# Patient Record
Sex: Female | Born: 1985 | Race: White | Hispanic: No | Marital: Single | Smoking: Former smoker
Health system: Southern US, Community
[De-identification: ages and names within clinical notes are randomized; demographics above are authoritative.]

## PROBLEM LIST (undated history)

## (undated) DIAGNOSIS — F32A Depression, unspecified: Secondary | ICD-10-CM

## (undated) DIAGNOSIS — F329 Major depressive disorder, single episode, unspecified: Secondary | ICD-10-CM

## (undated) DIAGNOSIS — F419 Anxiety disorder, unspecified: Secondary | ICD-10-CM

## (undated) DIAGNOSIS — J45909 Unspecified asthma, uncomplicated: Secondary | ICD-10-CM

## (undated) HISTORY — DX: Major depressive disorder, single episode, unspecified: F32.9

## (undated) HISTORY — DX: Depression, unspecified: F32.A

## (undated) HISTORY — DX: Unspecified asthma, uncomplicated: J45.909

## (undated) HISTORY — PX: TONSILLECTOMY: SUR1361

## (undated) HISTORY — DX: Anxiety disorder, unspecified: F41.9

---

## 2012-07-27 ENCOUNTER — Encounter: Payer: BC Managed Care – PPO | Admitting: Physician Assistant

## 2012-07-27 ENCOUNTER — Ambulatory Visit (INDEPENDENT_AMBULATORY_CARE_PROVIDER_SITE_OTHER): Payer: BC Managed Care – PPO | Admitting: Family Medicine

## 2012-07-27 VITALS — BP 118/76 | HR 100 | Temp 99.2°F | Resp 18 | Ht 63.0 in | Wt 114.0 lb

## 2012-07-27 DIAGNOSIS — R6889 Other general symptoms and signs: Secondary | ICD-10-CM

## 2012-07-27 DIAGNOSIS — R05 Cough: Secondary | ICD-10-CM

## 2012-07-27 DIAGNOSIS — J310 Chronic rhinitis: Secondary | ICD-10-CM

## 2012-07-27 DIAGNOSIS — J111 Influenza due to unidentified influenza virus with other respiratory manifestations: Secondary | ICD-10-CM

## 2012-07-27 DIAGNOSIS — R509 Fever, unspecified: Secondary | ICD-10-CM

## 2012-07-27 DIAGNOSIS — J069 Acute upper respiratory infection, unspecified: Secondary | ICD-10-CM

## 2012-07-27 DIAGNOSIS — R059 Cough, unspecified: Secondary | ICD-10-CM

## 2012-07-27 LAB — POCT CBC
Granulocyte percent: 70.4 % (ref 37–80)
HCT, POC: 43.8 % (ref 37.7–47.9)
Hemoglobin: 13.6 g/dL (ref 12.2–16.2)
Lymph, poc: 1 (ref 0.6–3.4)
MCH, POC: 30 pg (ref 27–31.2)
MCHC: 31.1 g/dL — AB (ref 31.8–35.4)
MCV: 96.5 fL (ref 80–97)
MID (cbc): 0.3 (ref 0–0.9)
MPV: 9.3 fL (ref 0–99.8)
POC Granulocyte: 3.1 (ref 2–6.9)
POC LYMPH PERCENT: 22.4 %L (ref 10–50)
POC MID %: 7.2 %M (ref 0–12)
Platelet Count, POC: 167 10*3/uL (ref 142–424)
RBC: 4.54 M/uL (ref 4.04–5.48)
RDW, POC: 14 %
WBC: 4.4 10*3/uL — AB (ref 4.6–10.2)

## 2012-07-27 MED ORDER — BENZONATATE 100 MG PO CAPS: 200.0000 mg | ORAL_CAPSULE | Freq: Two times a day (BID) | ORAL | Status: DC | PRN

## 2012-07-27 MED ORDER — AMOXICILLIN-POT CLAVULANATE 875-125 MG PO TABS: 1.0000 | ORAL_TABLET | Freq: Two times a day (BID) | ORAL | Status: DC

## 2012-07-27 MED ORDER — FLUTICASONE PROPIONATE 50 MCG/ACT NA SUSP: 2.0000 | Freq: Every day | NASAL | Status: DC

## 2012-07-27 MED ORDER — ALBUTEROL SULFATE HFA 108 (90 BASE) MCG/ACT IN AERS: 2.0000 | INHALATION_SPRAY | Freq: Four times a day (QID) | RESPIRATORY_TRACT | Status: DC | PRN

## 2012-07-27 MED ORDER — HYDROCODONE-HOMATROPINE 5-1.5 MG/5ML PO SYRP: 5.0000 mL | ORAL_SOLUTION | Freq: Every evening | ORAL | Status: DC | PRN

## 2012-07-27 NOTE — Progress Notes (Signed)
 [facility]:  Office Visit  Chief Complaint:  Chief Complaint  Patient presents with  . Nasal Congestion    x 3 days  . Generalized Body Aches  . Wheezing    sob    HPI: Chloe Hunt is a 27 y.o. female who complains of  2-3 day history of + congestion, subjective fevers, body aches She has a h/o asthma and has been wheezing with coughing. She has been using advair and also her albuterol nebs.  She was in [place], returned Tuesday Morning. Then started having sxs.  Wheezing all day with cough. On advair. She does not have a rescue inhaler.    Past Medical History  Diagnosis Date  . Asthma    Past Surgical History  Procedure Laterality Date  . Tonsills remove     History   Social History  . Marital Status: Single    Spouse Name: N/A    Number of Children: N/A  . Years of Education: N/A   Social History Main Topics  . Smoking status: Current Some Day Smoker  . Smokeless tobacco: None  . Alcohol Use: No  . Drug Use: No  . Sexually Active: Yes   Other Topics Concern  . None   Social History Narrative  . None   Family History  Problem Relation Age of Onset  . Osteoporosis Mother   . COPD Father   . COPD Paternal Grandmother    No Known Allergies Prior to Admission medications   Medication Sig Start Date End Date Taking? Authorizing Provider         Fluticasone-Salmeterol (ADVAIR) 250-50 MCG/DOSE AEPB Inhale 1 puff into the lungs every 12 (twelve) hours.   Yes Historical Provider, MD     ROS: The patient denies  night sweats, unintentional weight loss, chest pain, palpitations, dyspnea on exertion, nausea, vomiting, abdominal pain, dysuria, hematuria, melena, numbness,  or tingling.   All other systems have been reviewed and were otherwise negative with the exception of those mentioned in the HPI and as above.    PHYSICAL EXAM: Filed Vitals:   07/27/12 1325  BP: 115/76  Pulse: 100  Temp: 99.2 F (37.3 C)  Resp: 18   Filed  Vitals:   07/27/12 1325  Height: 5\' 3"  (1.6 m)  Weight: 114 lb 3.2 oz (51.801 kg)  Spo2   99% Body mass index is 20.23 kg/(m^2).    General: Alert, no acute distress HEENT:  Normocephalic, atraumatic, oropharynx patent. + sinus congestion and pressure, erythematous, boggy nares.  TM bulging but not infected. No exudates Cardiovascular:  Regular rate and rhythm, no rubs murmurs or gallops.  No Carotid bruits, radial pulse intact. No pedal edema.  Respiratory: Clear to auscultation bilaterally.  No wheezes, rales, or rhonchi.  No cyanosis, no use of accessory musculature GI: No organomegaly, abdomen is soft and non-tender, positive bowel sounds.  No masses. Skin: No rashes. Neurologic: Facial musculature symmetric. Psychiatric: Patient is appropriate throughout our interaction. Lymphatic: + right small cervical lymphadenopathy Musculoskeletal: Gait intact.   LABS: Results for orders placed in visit on 07/27/12  POCT CBC      Result Value Range   WBC 4.4 (*) 4.6 - 10.2 K/uL   Lymph, poc 1.0  0.6 - 3.4   POC LYMPH PERCENT 22.4  10 - 50 %L   MID (cbc) 0.3  0 - 0.9   POC MID % 7.2  0 - 12 %M   POC Granulocyte 3.1  2 -  6.9   Granulocyte percent 70.4  37 - 80 %G   RBC 4.54  4.04 - 5.48 M/uL   Hemoglobin 13.6  12.2 - 16.2 g/dL   HCT, POC 16.1  09.6 - 47.9 %   MCV 96.5  80 - 97 fL   MCH, POC 30.0  27 - 31.2 pg   MCHC 31.1 (*) 31.8 - 35.4 g/dL   RDW, POC 04.5     Platelet Count, POC 167  142 - 424 K/uL   MPV 9.3  0 - 99.8 fL     EKG/XRAY:   Primary read interpreted by Dr. Conley Rolls at [facility].   ASSESSMENT/PLAN: Encounter Diagnoses  Name Primary?  . Flu-like symptoms Yes  . Fever, unspecified   . Acute upper respiratory infections of unspecified site   . Cough   . Rhinitis, non-allergic    Outside window for flu swab and Tamiflu. 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

## 2012-07-27 NOTE — Progress Notes (Deleted)
[facility]:  Office Visit  Chief Complaint:  Chief Complaint  Patient presents with  . Nasal Congestion    x 3 days  . Generalized Body Aches  . Wheezing    sob    HPI: Chloe Hunt is a 27 y.o. female who complains of  2-3 day history of + congestion, subjective fevers, body aches She has a h/o asthma nad has wheezing with coughing. She has been using advair and also her albuterol nebs.  She was in [place], returned Tuesday Morning. Then started having sxs.  Wheezing all day with cough. On advair. She does not have a rescue inhaler.    Past Medical History  Diagnosis Date  . Asthma    Past Surgical History  Procedure Laterality Date  . Tonsills remove     History   Social History  . Marital Status: Single    Spouse Name: N/A    Number of Children: N/A  . Years of Education: N/A   Social History Main Topics  . Smoking status: Current Some Day Smoker  . Smokeless tobacco: None  . Alcohol Use: No  . Drug Use: No  . Sexually Active: Yes   Other Topics Concern  . None   Social History Narrative  . None   Family History  Problem Relation Age of Onset  . Osteoporosis Mother   . COPD Father   . COPD Paternal Grandmother    No Known Allergies Prior to Admission medications   Medication Sig Start Date End Date Taking? Authorizing Provider  ALPRAZolam Prudy Feeler) 0.25 MG tablet Take 0.25 mg by mouth at bedtime as needed for sleep.   Yes Historical Provider, MD  Fluticasone-Salmeterol (ADVAIR) 250-50 MCG/DOSE AEPB Inhale 1 puff into the lungs every 12 (twelve) hours.   Yes Historical Provider, MD     ROS: The patient denies fevers, chills, night sweats, unintentional weight loss, chest pain, palpitations, wheezing, dyspnea on exertion, nausea, vomiting, abdominal pain, dysuria, hematuria, melena, numbness, weakness, or tingling.   All other systems have been reviewed and were otherwise negative with the exception of those mentioned in the HPI and  as above.    PHYSICAL EXAM: Filed Vitals:   07/27/12 1325  BP: 115/76  Pulse: 112  Temp: 99.2 F (37.3 C)  Resp: 18   Filed Vitals:   07/27/12 1325  Height: 5\' 3"  (1.6 m)  Weight: 114 lb 3.2 oz (51.801 kg)   Body mass index is 20.23 kg/(m^2).  General: Alert, no acute distress HEENT:  Normocephalic, atraumatic, oropharynx patent. + sinus congestion and pressure, erythematous, boggy nares.  TM bulging but not infected Cardiovascular:  Regular rate and rhythm, no rubs murmurs or gallops.  No Carotid bruits, radial pulse intact. No pedal edema.  Respiratory: Clear to auscultation bilaterally.  No wheezes, rales, or rhonchi.  No cyanosis, no use of accessory musculature GI: No organomegaly, abdomen is soft and non-tender, positive bowel sounds.  No masses. Skin: No rashes. Neurologic: Facial musculature symmetric. Psychiatric: Patient is appropriate throughout our interaction. Lymphatic: + small cervical lymphadenopathy Musculoskeletal: Gait intact.   LABS: Results for orders placed in visit on 07/27/12  POCT CBC      Result Value Range   WBC 4.4 (*) 4.6 - 10.2 K/uL   Lymph, poc 1.0  0.6 - 3.4   POC LYMPH PERCENT 22.4  10 - 50 %L   MID (cbc) 0.3  0 - 0.9   POC MID % 7.2  0 - 12 %  M   POC Granulocyte 3.1  2 - 6.9   Granulocyte percent 70.4  37 - 80 %G   RBC 4.54  4.04 - 5.48 M/uL   Hemoglobin 13.6  12.2 - 16.2 g/dL   HCT, POC 62.1  30.8 - 47.9 %   MCV 96.5  80 - 97 fL   MCH, POC 30.0  27 - 31.2 pg   MCHC 31.1 (*) 31.8 - 35.4 g/dL   RDW, POC 65.7     Platelet Count, POC 167  142 - 424 K/uL   MPV 9.3  0 - 99.8 fL     EKG/XRAY:   Primary read interpreted by Dr. Conley Rolls at [facility]   ASSESSMENT/PLAN: Encounter Diagnoses  Name Primary?  . Flu-like symptoms Yes  . Fever, unspecified   . Acute upper respiratory infections of unspecified site   . Cough   . Rhinitis, non-allergic     Rx Flonase Rx Augmentin Rx Tussionex and Hydromet Rx Albuterol inhaler F/u prn if no  improvement in 2 days   Rikia Sukhu PHUONG, DO 07/27/2012 2:23 PM

## 2012-07-28 ENCOUNTER — Encounter: Payer: Self-pay | Admitting: Family Medicine

## 2012-10-04 NOTE — Progress Notes (Signed)
This encounter was created in error - please disregard.

## 2012-10-05 ENCOUNTER — Other Ambulatory Visit: Payer: Self-pay | Admitting: Family Medicine

## 2013-10-13 ENCOUNTER — Inpatient Hospital Stay (HOSPITAL_COMMUNITY)
Admission: AD | Admit: 2013-10-13 | Discharge: 2013-10-13 | Disposition: A | Discharge status: Home / Self Care | Payer: BC Managed Care – PPO | Source: Ambulatory Visit | Attending: Obstetrics & Gynecology | Admitting: Obstetrics & Gynecology

## 2013-10-13 ENCOUNTER — Encounter (HOSPITAL_COMMUNITY): Payer: Self-pay | Admitting: *Deleted

## 2013-10-13 DIAGNOSIS — R109 Unspecified abdominal pain: Secondary | ICD-10-CM | POA: Insufficient documentation

## 2013-10-13 DIAGNOSIS — F17200 Nicotine dependence, unspecified, uncomplicated: Secondary | ICD-10-CM | POA: Insufficient documentation

## 2013-10-13 DIAGNOSIS — F341 Dysthymic disorder: Secondary | ICD-10-CM | POA: Insufficient documentation

## 2013-10-13 DIAGNOSIS — R110 Nausea: Secondary | ICD-10-CM | POA: Diagnosis not present

## 2013-10-13 DIAGNOSIS — J45909 Unspecified asthma, uncomplicated: Secondary | ICD-10-CM | POA: Insufficient documentation

## 2013-10-13 LAB — URINALYSIS, ROUTINE W REFLEX MICROSCOPIC
BILIRUBIN URINE: NEGATIVE
GLUCOSE, UA: NEGATIVE mg/dL
KETONES UR: 15 mg/dL — AB
Leukocytes, UA: NEGATIVE
Nitrite: NEGATIVE
PROTEIN: NEGATIVE mg/dL
Specific Gravity, Urine: <1.005 — ABNORMAL LOW (ref 1.005–1.030)
UROBILINOGEN UA: 0.2 mg/dL (ref 0.0–1.0)
pH: 6 (ref 5.0–8.0)

## 2013-10-13 LAB — URINE MICROSCOPIC-ADD ON

## 2013-10-13 LAB — POCT PREGNANCY, URINE: Preg Test, Ur: NEGATIVE

## 2013-10-13 MED ORDER — ONDANSETRON HCL 4 MG/2ML IJ SOLN
INTRAMUSCULAR | Status: AC
  Filled 2013-10-13: qty 2

## 2013-10-13 MED ORDER — CYCLOBENZAPRINE HCL 10 MG PO TABS
10.0000 mg | ORAL_TABLET | Freq: Once | ORAL | Status: AC
  Administered 2013-10-13: 10 mg via ORAL
  Filled 2013-10-13: qty 1

## 2013-10-13 MED ORDER — PROMETHAZINE HCL 25 MG PO TABS
25.0000 mg | ORAL_TABLET | Freq: Once | ORAL | Status: AC
  Administered 2013-10-13: 25 mg via ORAL
  Filled 2013-10-13: qty 1

## 2013-10-13 MED ORDER — ONDANSETRON HCL 8 MG PO TABS: 8.0000 mg | ORAL_TABLET | Freq: Three times a day (TID) | ORAL | Status: DC | PRN

## 2013-10-13 MED ORDER — ONDANSETRON HCL 4 MG/2ML IJ SOLN
4.0000 mg | Freq: Once | INTRAMUSCULAR | Status: AC
  Administered 2013-10-13: 4 mg via INTRAMUSCULAR

## 2013-10-13 MED ORDER — METOCLOPRAMIDE HCL 5 MG/ML IJ SOLN
10.0000 mg | Freq: Once | INTRAMUSCULAR | Status: AC
  Administered 2013-10-13: 10 mg via INTRAVENOUS
  Filled 2013-10-13: qty 2

## 2013-10-13 MED ORDER — DEXTROSE 5 % IN LACTATED RINGERS IV BOLUS: 1000.0000 mL | Freq: Once | INTRAVENOUS | Status: DC

## 2013-10-13 MED ORDER — LACTATED RINGERS IV BOLUS (SEPSIS)
1000.0000 mL | Freq: Once | INTRAVENOUS | Status: AC
  Administered 2013-10-13: 1000 mL via INTRAVENOUS

## 2013-10-13 NOTE — MAU Note (Signed)
Severe nausea all day today, no actual vomiting, having severe cramping with periods, also back spasms.  Period is tapering off today.

## 2013-10-13 NOTE — Discharge Instructions (Signed)

## 2013-10-13 NOTE — MAU Provider Note (Signed)
First Provider Initiated Contact with Patient 10/13/13 1822      Chief Complaint:  Nausea and Abdominal Pain   Chloe Hunt is  28 y.o. G0P0.  Patient's last menstrual period was 10/09/2013.Marland Kitchen.  Her pregnancy status is negative.  She presents complaining of Nausea and Abdominal Pain She had "terrible cramps" with this period. Her abdominal pain (cramping) is almost gone, declines meds.  Her main concern is her intractable nausea today, no vomiting.  She is not on birth control and does not want to be pregnant  Past Medical History  Diagnosis Date  . Asthma   . Anxiety   . Depression     Past Surgical History  Procedure Laterality Date  . Tonsillectomy      Family History  Problem Relation Age of Onset  . Osteoporosis Mother   . COPD Father     History  Substance Use Topics  . Smoking status: Light Tobacco Smoker  . Smokeless tobacco: Not on file  . Alcohol Use: Yes     Comment: socially    Allergies: Not on File  Prescriptions prior to admission  Medication Sig Dispense Refill  . ADVAIR DISKUS 250-50 MCG/DOSE AEPB TAKE 1 PUFF TWICE A DAY  60 each  3     Review of Systems   Constitutional: Negative for fever and chills Eyes: Negative for visual disturbances Respiratory: Negative for shortness of breath, dyspnea Cardiovascular: Negative for chest pain or palpitations  Gastrointestinal: Negative for vomiting, diarrhea and constipation Genitourinary: Negative for dysuria and urgency Musculoskeletal: Negative for joint pain, myalgias  Neurological: Negative for dizziness and headaches     Physical Exam   Blood pressure 116/71, pulse 85, temperature 98.2 F (36.8 C), temperature source Oral, resp. rate 20, height 5\' 3"  (1.6 m), weight 51.347 kg (113 lb 3.2 oz), last menstrual period 10/09/2013.  General: General appearance - alert, well appearing, and in no distress Chest - clear to auscultation, no wheezes, rales or rhonchi, symmetric air entry Heart -  normal rate and regular rhythm Abdomen - soft, nontender, nondistended, no masses or organomegaly Pelvic - examination not indicated Extremities - no pedal edema noted   Labs: Results for orders placed during the hospital encounter of 10/13/13 (from the past 24 hour(s))  URINALYSIS, ROUTINE W REFLEX MICROSCOPIC   Collection Time    10/13/13  5:35 PM      Result Value Ref Range   Color, Urine YELLOW  YELLOW   APPearance CLEAR  CLEAR   Specific Gravity, Urine <1.005 (*) 1.005 - 1.030   pH 6.0  5.0 - 8.0   Glucose, UA NEGATIVE  NEGATIVE mg/dL   Hgb urine dipstick SMALL (*) NEGATIVE   Bilirubin Urine NEGATIVE  NEGATIVE   Ketones, ur 15 (*) NEGATIVE mg/dL   Protein, ur NEGATIVE  NEGATIVE mg/dL   Urobilinogen, UA 0.2  0.0 - 1.0 mg/dL   Nitrite NEGATIVE  NEGATIVE   Leukocytes, UA NEGATIVE  NEGATIVE  URINE MICROSCOPIC-ADD ON   Collection Time    10/13/13  5:35 PM      Result Value Ref Range   Squamous Epithelial / LPF RARE  RARE   RBC / HPF 0-2  <3 RBC/hpf  POCT PREGNANCY, URINE   Collection Time    10/13/13  5:43 PM      Result Value Ref Range   Preg Test, Ur NEGATIVE  NEGATIVE   Imaging Studies:  No results found.   Assessment: Nausea of unknown origin, possible GI virus  Plan:  IM Zofran; relieved somewhat.  IVF bolus of LR and PO phengergan.  Still not relieved.  D5LR and Reglan IV given.  Feels better, but still has "bubbles in my stomach". Will Rx Flexeril and pt to go home and try to sleep  Zofran Rx sent to pharmacy  List of OB/GYN's in the area given; recommended seeing one to possibly start birth control   CRESENZO-DISHMAN,Brennin Durfee

## 2013-10-15 ENCOUNTER — Ambulatory Visit (HOSPITAL_BASED_OUTPATIENT_CLINIC_OR_DEPARTMENT_OTHER)
Admission: RE | Admit: 2013-10-15 | Discharge: 2013-10-15 | Disposition: A | Discharge status: Home / Self Care | Payer: BC Managed Care – PPO | Source: Ambulatory Visit | Attending: Family Medicine | Admitting: Family Medicine

## 2013-10-15 ENCOUNTER — Ambulatory Visit (INDEPENDENT_AMBULATORY_CARE_PROVIDER_SITE_OTHER): Payer: BC Managed Care – PPO | Admitting: Family Medicine

## 2013-10-15 ENCOUNTER — Ambulatory Visit (INDEPENDENT_AMBULATORY_CARE_PROVIDER_SITE_OTHER): Payer: BC Managed Care – PPO

## 2013-10-15 VITALS — BP 106/73 | HR 109 | Temp 97.4°F | Resp 16 | Ht 63.0 in | Wt 114.0 lb

## 2013-10-15 DIAGNOSIS — R8271 Bacteriuria: Secondary | ICD-10-CM

## 2013-10-15 DIAGNOSIS — F411 Generalized anxiety disorder: Secondary | ICD-10-CM

## 2013-10-15 DIAGNOSIS — N920 Excessive and frequent menstruation with regular cycle: Secondary | ICD-10-CM | POA: Diagnosis not present

## 2013-10-15 DIAGNOSIS — R110 Nausea: Secondary | ICD-10-CM

## 2013-10-15 DIAGNOSIS — Z124 Encounter for screening for malignant neoplasm of cervix: Secondary | ICD-10-CM

## 2013-10-15 DIAGNOSIS — N83209 Unspecified ovarian cyst, unspecified side: Secondary | ICD-10-CM

## 2013-10-15 DIAGNOSIS — R82998 Other abnormal findings in urine: Secondary | ICD-10-CM

## 2013-10-15 DIAGNOSIS — N83202 Unspecified ovarian cyst, left side: Secondary | ICD-10-CM

## 2013-10-15 LAB — POCT URINALYSIS DIPSTICK
Bilirubin, UA: NEGATIVE
Glucose, UA: NEGATIVE
KETONES UA: NEGATIVE
Leukocytes, UA: NEGATIVE
Nitrite, UA: NEGATIVE
Protein, UA: NEGATIVE
RBC UA: NEGATIVE
Spec Grav, UA: 1.01
UROBILINOGEN UA: 0.2
pH, UA: 7

## 2013-10-15 LAB — POCT WET PREP WITH KOH
CLUE CELLS WET PREP PER HPF POC: NEGATIVE
KOH PREP POC: NEGATIVE
RBC WET PREP PER HPF POC: NEGATIVE
TRICHOMONAS UA: NEGATIVE
Yeast Wet Prep HPF POC: NEGATIVE

## 2013-10-15 LAB — POCT CBC
Granulocyte percent: 65.4 %G (ref 37–80)
HEMATOCRIT: 42.6 % (ref 37.7–47.9)
Hemoglobin: 13.9 g/dL (ref 12.2–16.2)
Lymph, poc: 2 (ref 0.6–3.4)
MCH: 30.4 pg (ref 27–31.2)
MCHC: 32.6 g/dL (ref 31.8–35.4)
MCV: 93.1 fL (ref 80–97)
MID (CBC): 0.3 (ref 0–0.9)
MPV: 8.2 fL (ref 0–99.8)
POC Granulocyte: 4.4 (ref 2–6.9)
POC LYMPH PERCENT: 29.8 %L (ref 10–50)
POC MID %: 4.8 %M (ref 0–12)
Platelet Count, POC: 210 10*3/uL (ref 142–424)
RBC: 4.57 M/uL (ref 4.04–5.48)
RDW, POC: 12.9 %
WBC: 6.7 10*3/uL (ref 4.6–10.2)

## 2013-10-15 LAB — COMPREHENSIVE METABOLIC PANEL
ALBUMIN: 4.5 g/dL (ref 3.5–5.2)
ALK PHOS: 47 U/L (ref 39–117)
ALT: 8 U/L (ref 0–35)
AST: 13 U/L (ref 0–37)
BUN: 8 mg/dL (ref 6–23)
CO2: 26 meq/L (ref 19–32)
Calcium: 9.3 mg/dL (ref 8.4–10.5)
Chloride: 103 mEq/L (ref 96–112)
Creat: 0.72 mg/dL (ref 0.50–1.10)
GLUCOSE: 89 mg/dL (ref 70–99)
POTASSIUM: 3.8 meq/L (ref 3.5–5.3)
Sodium: 137 mEq/L (ref 135–145)
TOTAL PROTEIN: 6.8 g/dL (ref 6.0–8.3)
Total Bilirubin: 1.2 mg/dL (ref 0.2–1.2)

## 2013-10-15 LAB — POCT UA - MICROSCOPIC ONLY
Bacteria, U Microscopic: NEGATIVE
CASTS, UR, LPF, POC: NEGATIVE
Crystals, Ur, HPF, POC: NEGATIVE
EPITHELIAL CELLS, URINE PER MICROSCOPY: NEGATIVE
MUCUS UA: NEGATIVE
Yeast, UA: NEGATIVE

## 2013-10-15 LAB — POCT URINE PREGNANCY: Preg Test, Ur: NEGATIVE

## 2013-10-15 MED ORDER — ALPRAZOLAM 0.25 MG PO TABS: 0.2500 mg | ORAL_TABLET | Freq: Two times a day (BID) | ORAL | Status: DC | PRN

## 2013-10-15 NOTE — Patient Instructions (Signed)
I will be in touch with the rest of your labs and with your ultrasound report (probably later today for the ultrasound).  Use the xanax as needed for anxiety- however remember this medication CAN be habit forming and will cause sedation.  Use sparingly.  If anxiety is a more consistent problem we can try a medication such as wellbutrin.  At this time I do not see any evidence of a more serious problem such as appendicitis or gallbladder pain.  However, if you develop pain, fever, vomiting or other concerns seek care.   Your ultrasound will be at [facility] at 4pm.   Drink 32 ounces of H2O and hour prior to your scan and don't urinate.  Go through the ED but you do NOT need to be seen in the ED  [facility]  Address: [address], [place], [place] [address]  [phone]

## 2013-10-15 NOTE — Progress Notes (Addendum)
[facility] [address] [place] [address] [phone]  Date:  10/15/2013   Name:  Chloe Hunt   DOB:  09/12/1985   MRN:  [id number]  PCP:  No PCP Per Patient    Chief Complaint: Fatigue, Nausea and Anorexia   History of Present Illness:  Chloe Hunt is a 28 y.o. very pleasant female patient who presents with the following:  She is here today with illness for the last 3 days. She notes "severe nausea" but no vomiting.  She does have some diarrhea.  She was tx with fluids, zofran and reglan when she was at [facility] on 8/10.   She states she does not have abdominal pain.  She went to [facility] because she thought her sx might be due to her menses. She had a lot of cramping and pain with her 2 most recent menses.  Her period is now nealy over.   She has not had this issue the past.   She is not aware of any family history of gallbladder disease or IBD.  She has not been eating much- she has been able to eat just some soft and bland foods.   Yesterday she felt a little better for a while, but this did not last.  She notes a mild HA, no fever  She is taking zofran and xanax as needed for her sx.    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   Admits to some anxiety and stress.  She has some sx of depression in the winter months but otherwise this is not generally an issue for her.  However anxiety is a fairly regular issue.  Her last PCP in [place] was treating her with xanax and propranolol.  However she did not notice much relief from the BB.  She was on zoloft in the past but "it made me feel like nothing," she did not like taking it  Last pap about 4 years ago- she would like to update this today  Wt Readings from Last 3 Encounters:  10/15/13 114 lb (51.71 kg)  10/13/13 113 lb 3.2 oz (51.347 kg)  07/28/12 114 lb (51.71 kg)     There are no active problems to display for this patient.   Past Medical History  Diagnosis Date  . Asthma    . Anxiety   . Depression     Past Surgical History  Procedure Laterality Date  . Tonsillectomy      History  Substance Use Topics  . Smoking status: Light Tobacco Smoker  . Smokeless tobacco: Not on file  . Alcohol Use: Yes     Comment: socially    Family History  Problem Relation Age of Onset  . Osteoporosis Mother   . COPD Father     No Known Allergies  Medication list has been reviewed and updated.  Current Outpatient Prescriptions on File Prior to Visit  Medication Sig Dispense Refill  . Acetaminophen-Caff-Pyrilamine (MIDOL COMPLETE PO) Take 1 tablet by mouth daily as needed.      Marland Kitchen ADVAIR DISKUS 250-50 MCG/DOSE AEPB TAKE 1 PUFF TWICE A DAY  60 each  3  . ALPRAZolam (XANAX) 0.25 MG tablet Take 0.25 mg by mouth daily as needed for anxiety.      Marland Kitchen ibuprofen (ADVIL,MOTRIN) 200 MG tablet Take 400 mg by mouth 3 (three) times daily as needed for cramping.      . ondansetron (ZOFRAN) 8 MG tablet  Take 1 tablet (8 mg total) by mouth every 8 (eight) hours as needed for nausea or vomiting.  30 tablet  1   No current facility-administered medications on file prior to visit.    Review of Systems:  As per HPI- otherwise negative.  Physical Examination: Filed Vitals:   10/15/13 1131  BP: 106/73  Pulse: 109  Temp: 97.4 F (36.3 C)  Resp: 16   Filed Vitals:   10/15/13 1131  Height: 5\' 3"  (1.6 m)  Weight: 114 lb (51.71 kg)   Body mass index is 20.2 kg/(m^2). Ideal Body Weight: Weight in (lb) to have BMI = 25: 140.8  GEN: WDWN, NAD, Non-toxic, A & O x 3, looks well.  Here today with her BF who is supportive HEENT: Atraumatic, Normocephalic. Neck supple. No masses, No LAD.  Bilateral TM wnl, oropharynx normal.  PEERL,EOMI.   Ears and Nose: No external deformity. CV: RRR, No M/G/R. No JVD. No thrill. No extra heart sounds. PULM: CTA B, no wheezes, crackles, rhonchi. No retractions. No resp. distress. No accessory muscle use. ABD: S, NT, ND, +BS. No rebound. No HSM.   Benign exam EXTR: No c/c/e NEURO Normal gait.  PSYCH: Normally interactive. Conversant. Not depressed or anxious appearing.  Calm demeanor.  Pelvic: normal, no vaginal lesions or discharge. Uterus normal, no CMT, no adnexal tendereness or masses    Results for orders placed in visit on 10/15/13  POCT CBC      Result Value Ref Range   WBC 6.7  4.6 - 10.2 K/uL   Lymph, poc 2.0  0.6 - 3.4   POC LYMPH PERCENT 29.8  10 - 50 %L   MID (cbc) 0.3  0 - 0.9   POC MID % 4.8  0 - 12 %M   POC Granulocyte 4.4  2 - 6.9   Granulocyte percent 65.4  37 - 80 %G   RBC 4.57  4.04 - 5.48 M/uL   Hemoglobin 13.9  12.2 - 16.2 g/dL   HCT, POC 16.142.6  09.637.7 - 47.9 %   MCV 93.1  80 - 97 fL   MCH, POC 30.4  27 - 31.2 pg   MCHC 32.6  31.8 - 35.4 g/dL   RDW, POC 04.512.9     Platelet Count, POC 210  142 - 424 K/uL   MPV 8.2  0 - 99.8 fL  POCT UA - MICROSCOPIC ONLY      Result Value Ref Range   WBC, Ur, HPF, POC 0-2     RBC, urine, microscopic 0-2     Bacteria, U Microscopic neg     Mucus, UA neg     Epithelial cells, urine per micros neg     Crystals, Ur, HPF, POC neg     Casts, Ur, LPF, POC neg     Yeast, UA neg    POCT URINALYSIS DIPSTICK      Result Value Ref Range   Color, UA yellow     Clarity, UA clear     Glucose, UA neg     Bilirubin, UA neg     Ketones, UA neg     Spec Grav, UA 1.010     Blood, UA neg     pH, UA 7.0     Protein, UA neg     Urobilinogen, UA 0.2     Nitrite, UA neg     Leukocytes, UA Negative    POCT URINE PREGNANCY      Result Value Ref Range  Preg Test, Ur Negative    POCT WET PREP WITH KOH      Result Value Ref Range   Trichomonas, UA Negative     Clue Cells Wet Prep HPF POC neg     Epithelial Wet Prep HPF POC 6-12     Yeast Wet Prep HPF POC neg     Bacteria Wet Prep HPF POC 2+     RBC Wet Prep HPF POC neg     WBC Wet Prep HPF POC 2-6     KOH Prep POC Negative      [facility] reading (PRIMARY) by  Dr. Patsy Lager. abd series: negative.  No evidence of  obstruction ABDOMEN - 2 VIEW  COMPARISON: None  FINDINGS: Lung bases clear.  Normal bowel gas pattern.  No bowel dilatation, bowel wall thickening or free intraperitoneal air.  Bones unremarkable.  Probable tiny nonobstructing calculi and mid inferior pole of RIGHT kidney.  No additional urinary tract calcifications.  IMPRESSION: Question tiny nonobstructing RIGHT renal calculi.  Otherwise negative exam.   Assessment and Plan: Nausea without vomiting - Plan: POCT CBC, POCT UA - Microscopic Only, POCT urinalysis dipstick, POCT urine pregnancy, Urine culture, Comprehensive metabolic panel, POCT Wet Prep with KOH, DG Abd 2 Views, US Pelvis Complete, US Transvaginal Non-OB, CANCELED: US Pelvis Complete  Screening for cervical cancer - Plan: Pap IG, CT/NG w/ reflex HPV when ASC-U  GAD (generalized anxiety disorder) - Plan: ALPRAZolam (XANAX) 0.25 MG tablet  Here today with several days of nausea- uncertain origin.  Will have her do a pelvic ultrasound today to rule- out a cyst.  No evidence to suggest gallbladder colic or appendicitis.   She is overdue for a pap so did this today as well Anxiety; discussed in detail with her.  This is a significant issue for her right now- however she does not feel that her sx are consistent enough to warrant a full- time med (SSRI, etc) at this time. Gave a small supply of xanax for her to use- cautioned regarding sedation and habit formation  Meds ordered this encounter  Medications  . ALPRAZolam (XANAX) 0.25 MG tablet    Sig: Take 1 tablet (0.25 mg total) by mouth 2 (two) times daily as needed for anxiety.    Dispense:  20 tablet    Refill:  0     Send copy of x-ray with labs Signed Abbe Amsterdam, MD  Received ultrasound results;  TRANSABDOMINAL AND TRANSVAGINAL ULTRASOUND OF PELVIS  TECHNIQUE: Both transabdominal and transvaginal ultrasound examinations of the pelvis were performed. Transabdominal technique was performed  for global imaging of the pelvis including uterus, ovaries, adnexal regions, and pelvic cul-de-sac. It was necessary to proceed with endovaginal exam following the transabdominal exam to visualize the uterus, endometrium and ovaries to better advantage.  COMPARISON: None  FINDINGS: Uterus  Measurements: 5.4 cm x 3.6 cm by 4.4 cm. Uterus is retroverted. No fibroids or other mass visualized.  Endometrium  Thickness: 9 mm. No focal abnormality visualized.  Right ovary  Measurements: 4.3 cm x 2.7 cm x 3.3 cm. Normal appearance/no adnexal mass.  Left ovary  Measurements: 5.8 cm x 2.8 cm x 3.6 cm. There are 2 dominant cysts both containing internal echoes, largest measuring 3 point 6 cm x 2.8 cm the other measuring 2.5 cm in greatest dimension.  Other findings  Both ovaries show color Doppler blood flow. Ovaries are in close proximity along the posterior aspect of the pelvis. No free fluid.  IMPRESSION: 1. Left ovarian  cysts which contain internal echoes possibly from previous hemorrhage. Largest cyst measures 3.6 cm. 2. Normal uterus and endometrium. No adnexal masses. No free fluid.  Called her to go over results.  She does have a cyst- this will likely resolve, but may be the cause of her sx.  Will refer to OBG for follow-up. She will let me know if any other problems in the meantime.    Received her CMP results on 8/13- called her to go over them. She is feeling a lot better, and plans to follow-up with [place] OBG in [place] soon.  I will send her pap when it is available   Results for orders placed in visit on 10/15/13  COMPREHENSIVE METABOLIC PANEL      Result Value Ref Range   Sodium 137  135 - 145 mEq/L   Potassium 3.8  3.5 - 5.3 mEq/L   Chloride 103  96 - 112 mEq/L   CO2 26  19 - 32 mEq/L   Glucose, Bld 89  70 - 99 mg/dL   BUN 8  6 - 23 mg/dL   Creat 4.09  8.11 - 9.14 mg/dL   Total Bilirubin 1.2  0.2 - 1.2 mg/dL   Alkaline Phosphatase 47  39 - 117 U/L   AST 13   0 - 37 U/L   ALT 8  0 - 35 U/L   Total Protein 6.8  6.0 - 8.3 g/dL   Albumin 4.5  3.5 - 5.2 g/dL   Calcium 9.3  8.4 - 78.2 mg/dL  POCT CBC      Result Value Ref Range   WBC 6.7  4.6 - 10.2 K/uL   Lymph, poc 2.0  0.6 - 3.4   POC LYMPH PERCENT 29.8  10 - 50 %L   MID (cbc) 0.3  0 - 0.9   POC MID % 4.8  0 - 12 %M   POC Granulocyte 4.4  2 - 6.9   Granulocyte percent 65.4  37 - 80 %G   RBC 4.57  4.04 - 5.48 M/uL   Hemoglobin 13.9  12.2 - 16.2 g/dL   HCT, POC 95.6  21.3 - 47.9 %   MCV 93.1  80 - 97 fL   MCH, POC 30.4  27 - 31.2 pg   MCHC 32.6  31.8 - 35.4 g/dL   RDW, POC 08.6     Platelet Count, POC 210  142 - 424 K/uL   MPV 8.2  0 - 99.8 fL  POCT UA - MICROSCOPIC ONLY      Result Value Ref Range   WBC, Ur, HPF, POC 0-2     RBC, urine, microscopic 0-2     Bacteria, U Microscopic neg     Mucus, UA neg     Epithelial cells, urine per micros neg     Crystals, Ur, HPF, POC neg     Casts, Ur, LPF, POC neg     Yeast, UA neg    POCT URINALYSIS DIPSTICK      Result Value Ref Range   Color, UA yellow     Clarity, UA clear     Glucose, UA neg     Bilirubin, UA neg     Ketones, UA neg     Spec Grav, UA 1.010     Blood, UA neg     pH, UA 7.0     Protein, UA neg     Urobilinogen, UA 0.2     Nitrite, UA neg  Leukocytes, UA Negative    POCT URINE PREGNANCY      Result Value Ref Range   Preg Test, Ur Negative    POCT WET PREP WITH KOH      Result Value Ref Range   Trichomonas, UA Negative     Clue Cells Wet Prep HPF POC neg     Epithelial Wet Prep HPF POC 6-12     Yeast Wet Prep HPF POC neg     Bacteria Wet Prep HPF POC 2+     RBC Wet Prep HPF POC neg     WBC Wet Prep HPF POC 2-6     KOH Prep POC Negative     Called on 8/17 and left detailed message on machine- Received her urine culture- she great just 30k colonies of e coli.  This may or may not indicate a UTI or may be just contamination.  If she still has any sx she can certainly use an abx, and I will send keflex to her  drug store. However if she is now asymptomatic treatment is not required

## 2013-10-16 LAB — PAP IG, CT-NG, RFX HPV ASCU
Chlamydia Probe Amp: NEGATIVE
GC Probe Amp: NEGATIVE

## 2013-10-17 ENCOUNTER — Encounter: Payer: Self-pay | Admitting: Family Medicine

## 2013-10-19 LAB — URINE CULTURE: Colony Count: 30000

## 2013-10-20 MED ORDER — CEPHALEXIN 500 MG PO CAPS: 500.0000 mg | ORAL_CAPSULE | Freq: Two times a day (BID) | ORAL | Status: DC

## 2013-10-20 NOTE — Addendum Note (Signed)
Addended by: Abbe AmsterdamOPLAND, Darin Arndt C on: 10/20/2013 05:25 PM   Modules accepted: Orders

## 2013-10-22 ENCOUNTER — Encounter (HOSPITAL_COMMUNITY): Payer: Self-pay | Admitting: Emergency Medicine

## 2013-10-22 ENCOUNTER — Emergency Department (HOSPITAL_COMMUNITY)
Admission: EM | Admit: 2013-10-22 | Discharge: 2013-10-22 | Disposition: A | Discharge status: Home / Self Care | Payer: BC Managed Care – PPO | Attending: Emergency Medicine | Admitting: Emergency Medicine

## 2013-10-22 DIAGNOSIS — R110 Nausea: Secondary | ICD-10-CM | POA: Diagnosis not present

## 2013-10-22 DIAGNOSIS — IMO0002 Reserved for concepts with insufficient information to code with codable children: Secondary | ICD-10-CM | POA: Diagnosis not present

## 2013-10-22 DIAGNOSIS — F419 Anxiety disorder, unspecified: Secondary | ICD-10-CM

## 2013-10-22 DIAGNOSIS — F329 Major depressive disorder, single episode, unspecified: Secondary | ICD-10-CM | POA: Diagnosis not present

## 2013-10-22 DIAGNOSIS — Z791 Long term (current) use of non-steroidal anti-inflammatories (NSAID): Secondary | ICD-10-CM | POA: Diagnosis not present

## 2013-10-22 DIAGNOSIS — Z79899 Other long term (current) drug therapy: Secondary | ICD-10-CM | POA: Insufficient documentation

## 2013-10-22 DIAGNOSIS — F411 Generalized anxiety disorder: Secondary | ICD-10-CM | POA: Insufficient documentation

## 2013-10-22 DIAGNOSIS — Z792 Long term (current) use of antibiotics: Secondary | ICD-10-CM | POA: Diagnosis not present

## 2013-10-22 DIAGNOSIS — R197 Diarrhea, unspecified: Secondary | ICD-10-CM | POA: Diagnosis not present

## 2013-10-22 DIAGNOSIS — F17200 Nicotine dependence, unspecified, uncomplicated: Secondary | ICD-10-CM | POA: Diagnosis not present

## 2013-10-22 DIAGNOSIS — J45909 Unspecified asthma, uncomplicated: Secondary | ICD-10-CM | POA: Diagnosis not present

## 2013-10-22 DIAGNOSIS — F3289 Other specified depressive episodes: Secondary | ICD-10-CM | POA: Diagnosis not present

## 2013-10-22 LAB — URINE MICROSCOPIC-ADD ON

## 2013-10-22 LAB — URINALYSIS, ROUTINE W REFLEX MICROSCOPIC
Bilirubin Urine: NEGATIVE
Glucose, UA: NEGATIVE mg/dL
Ketones, ur: >80 mg/dL — AB
Leukocytes, UA: NEGATIVE
NITRITE: NEGATIVE
PH: 5.5 (ref 5.0–8.0)
Protein, ur: NEGATIVE mg/dL
SPECIFIC GRAVITY, URINE: 1.016 (ref 1.005–1.030)
Urobilinogen, UA: 0.2 mg/dL (ref 0.0–1.0)

## 2013-10-22 LAB — POC URINE PREG, ED: Preg Test, Ur: NEGATIVE

## 2013-10-22 MED ORDER — PROMETHAZINE HCL 25 MG PO TABS
25.0000 mg | ORAL_TABLET | Freq: Once | ORAL | Status: AC
  Administered 2013-10-22: 25 mg via ORAL
  Filled 2013-10-22: qty 1

## 2013-10-22 MED ORDER — PROMETHAZINE HCL 25 MG PO TABS: 25.0000 mg | ORAL_TABLET | Freq: Four times a day (QID) | ORAL | Status: DC | PRN

## 2013-10-22 MED ORDER — LORAZEPAM 1 MG PO TABS: 1.0000 mg | ORAL_TABLET | Freq: Three times a day (TID) | ORAL | Status: DC | PRN

## 2013-10-22 MED ORDER — LORAZEPAM 1 MG PO TABS
1.0000 mg | ORAL_TABLET | Freq: Once | ORAL | Status: AC
  Administered 2013-10-22: 1 mg via ORAL
  Filled 2013-10-22: qty 1

## 2013-10-22 NOTE — ED Notes (Signed)
Pt here from home with c/o insomnia only sleeping about 30 mins a day and an allergic reaction to some meds yesterday , pt states that she has been taking some xanax without relief, pt does have a  Appointment tomorrow with her regular doctor , pt Denis [place] , [place]

## 2013-10-22 NOTE — ED Provider Notes (Signed)
CSN: [id number]     Arrival date & time 10/22/13  0913 History   First MD Initiated Contact with Patient 10/22/13 1147     Chief Complaint  Patient presents with  . Anxiety  . Insomnia     (Consider location/radiation/quality/duration/timing/severity/associated sxs/prior Treatment) Patient is a 28 y.o. female presenting with general illness.  Illness Location:  Generalized Quality:  Anxiety, nausea Severity:  Moderate Onset quality:  Gradual Duration:  10 days Timing:  Constant Progression:  Unchanged Chronicity:  New Context:  Ho anxiety, recently started on citalopram Relieved by:  Nothing Worsened by:  Nothing Associated symptoms: diarrhea and nausea   Associated symptoms: no abdominal pain, no chest pain, no congestion, no cough, no fever, no rash, no rhinorrhea, no shortness of breath, no sore throat, no vomiting and no wheezing     Past Medical History  Diagnosis Date  . Asthma   . Anxiety   . Depression    Past Surgical History  Procedure Laterality Date  . Tonsillectomy     Family History  Problem Relation Age of Onset  . Osteoporosis Mother   . COPD Father    History  Substance Use Topics  . Smoking status: Light Tobacco Smoker  . Smokeless tobacco: Not on file  . Alcohol Use: Yes     Comment: socially   OB History   Grav Para Term Preterm Abortions TAB SAB Ect Mult Living   0              Review of Systems  Constitutional: Negative for fever and chills.  HENT: Negative for congestion, rhinorrhea and sore throat.   Eyes: Negative for photophobia and visual disturbance.  Respiratory: Negative for cough, shortness of breath and wheezing.   Cardiovascular: Negative for chest pain and leg swelling.  Gastrointestinal: Positive for nausea and diarrhea. Negative for vomiting, abdominal pain and constipation.  Endocrine: Negative for polyphagia and polyuria.  Genitourinary: Negative for dysuria, flank pain, vaginal bleeding, vaginal discharge and  enuresis.  Musculoskeletal: Negative for back pain and gait problem.  Skin: Negative for color change and rash.  Neurological: Negative for dizziness, syncope, light-headedness and numbness.  Hematological: Negative for adenopathy. Does not bruise/bleed easily.  All other systems reviewed and are negative.     Allergies  Citalopram  Home Medications   Prior to Admission medications   Medication Sig Start Date End Date Taking? Authorizing Provider  Acetaminophen-Caff-Pyrilamine (MIDOL COMPLETE PO) Take 1 tablet by mouth daily as needed.   Yes Historical Provider, MD  ALPRAZolam (XANAX) 0.25 MG tablet Take 1 tablet (0.25 mg total) by mouth 2 (two) times daily as needed for anxiety. 10/15/13  Yes Gwenlyn FoundJessica C Copland, MD  cephALEXin (KEFLEX) 500 MG capsule Take 1 capsule (500 mg total) by mouth 2 (two) times daily. 10/20/13  Yes Gwenlyn FoundJessica C Copland, MD  citalopram (CELEXA) 20 MG tablet Take 20 mg by mouth daily.   Yes Historical Provider, MD  Fluticasone-Salmeterol (ADVAIR) 250-50 MCG/DOSE AEPB Inhale 1 puff into the lungs 2 (two) times daily.   Yes Historical Provider, MD  ibuprofen (ADVIL,MOTRIN) 200 MG tablet Take 400 mg by mouth 3 (three) times daily as needed for cramping.   Yes Historical Provider, MD  ondansetron (ZOFRAN) 8 MG tablet Take 1 tablet (8 mg total) by mouth every 8 (eight) hours as needed for nausea or vomiting. 10/13/13  Yes Jacklyn ShellFrances Cresenzo-Dishmon, CNM  LORazepam (ATIVAN) 1 MG tablet Take 1 tablet (1 mg total) by mouth 3 (three) times daily as  needed for anxiety. 10/22/13   Mirian Mo, MD  promethazine (PHENERGAN) 25 MG tablet Take 1 tablet (25 mg total) by mouth every 6 (six) hours as needed for nausea or vomiting. 10/22/13   Mirian Mo, MD   BP 109/77  Pulse 75  Temp(Src) 98.1 F (36.7 C) (Oral)  Resp 24  SpO2 98%  LMP 10/09/2013 Physical Exam  Vitals reviewed. Constitutional: She is oriented to person, place, and time. She appears well-developed and  well-nourished.  HENT:  Head: Normocephalic and atraumatic.  Right Ear: External ear normal.  Left Ear: External ear normal.  Eyes: Conjunctivae and EOM are normal. Pupils are equal, round, and reactive to light.  Neck: Normal range of motion. Neck supple.  Cardiovascular: Normal rate, regular rhythm, normal heart sounds and intact distal pulses.   Pulmonary/Chest: Effort normal and breath sounds normal.  Abdominal: Soft. Bowel sounds are normal. There is no tenderness.  Musculoskeletal: Normal range of motion.  Neurological: She is alert and oriented to person, place, and time.  Skin: Skin is warm and dry.    ED Course  Procedures (including critical care time) Labs Review Labs Reviewed  URINALYSIS, ROUTINE W REFLEX MICROSCOPIC - Abnormal; Notable for the following:    Hgb urine dipstick TRACE (*)    Ketones, ur >80 (*)    All other components within normal limits  URINE MICROSCOPIC-ADD ON - Abnormal; Notable for the following:    Bacteria, UA FEW (*)    All other components within normal limits  POC URINE PREG, ED    Imaging Review No results found.   EKG Interpretation None      MDM   Final diagnoses:  Anxiety  Nausea    28 y.o. female  with pertinent PMH of anxiety, depression presents after she began to experience skin burning, increased anxiety yesterday after starting citalopram. 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 She denies abdominal pain, chest pain, fever, vomiting, constipation. She has had some relief with Zofran and Xanax, however states that she has been only minimally sleeping. On arrival her vital signs are as above without tachycardia.  Patient was given Ativan and Phenergan for symptoms, we discussed that she would likely not get total relief of the symptoms and should try Benadryl and potentially melatonin for sleep. UA checked and  urine pregnancy both of which were unremarkable. Likely etiology of patient's symptoms is anxiety, and she has followup tomorrow. The patient voiced understanding of precautions given for worsening symptoms agreed with plan and felt reassured..    Labs and imaging as above reviewed.   1. Anxiety   2. Nausea         Mirian Mo, MD 10/22/13 [phone]

## 2013-10-22 NOTE — Discharge Instructions (Signed)
°Nausea, Adult °Nausea is the feeling that you have an upset stomach or have to vomit. Nausea by itself is not likely a serious concern, but it may be an early sign of more serious medical problems. As nausea gets worse, it can lead to vomiting. If vomiting develops, there is the risk of dehydration.  °CAUSES  °· Viral infections. °· Food poisoning. °· Medicines. °· Pregnancy. °· Motion sickness. °· Migraine headaches. °· Emotional distress. °· Severe pain from any source. °· Alcohol intoxication. °HOME CARE INSTRUCTIONS °· Get plenty of rest. °· Ask your caregiver about specific rehydration instructions. °· Eat small amounts of food and sip liquids more often. °· Take all medicines as told by your caregiver. °SEEK MEDICAL CARE IF: °· You have not improved after 2 days, or you get worse. °· You have a headache. °SEEK IMMEDIATE MEDICAL CARE IF:  °· You have a fever. °· You faint. °· You keep vomiting or have blood in your vomit. °· You are extremely weak or dehydrated. °· You have dark or bloody stools. °· You have severe chest or abdominal pain. °MAKE SURE YOU: °· Understand these instructions. °· Will watch your condition. °· Will get help right away if you are not doing well or get worse. °Document Released: 03/30/2004 Document Revised: 11/15/2011 Document Reviewed: 11/02/2010 °ExitCare® Patient Information ©2015 [employer]. This information is not intended to replace advice given to you by your health care provider. Make sure you discuss any questions you have with your health care provider. ° ° °Generalized Anxiety Disorder °Generalized anxiety disorder (GAD) is a mental disorder. It interferes with life functions, including relationships, work, and school. °GAD is different from normal anxiety, which everyone experiences at some point in their lives in response to specific life events and activities. Normal anxiety actually helps us prepare for and get through these life events and activities. Normal  anxiety goes away after the event or activity is over.  °GAD causes anxiety that is not necessarily related to specific events or activities. It also causes excess anxiety in proportion to specific events or activities. The anxiety associated with GAD is also difficult to control. GAD can vary from mild to severe. People with severe GAD can have intense waves of anxiety with physical symptoms (panic attacks).  °SYMPTOMS °The anxiety and worry associated with GAD are difficult to control. This anxiety and worry are related to many life events and activities and also occur more days than not for 6 months or longer. People with GAD also have three or more of the following symptoms (one or more in children): °· Restlessness.   °· Fatigue. °· Difficulty concentrating.   °· Irritability. °· Muscle tension. °· Difficulty sleeping or unsatisfying sleep. °DIAGNOSIS °GAD is diagnosed through an assessment by your health care provider. Your health care provider will ask you questions about your mood, physical symptoms, and events in your life. Your health care provider may ask you about your medical history and use of alcohol or drugs, including prescription medicines. Your health care provider may also do a physical exam and blood tests. Certain medical conditions and the use of certain substances can cause symptoms similar to those associated with GAD. Your health care provider may refer you to a mental health specialist for further evaluation. °TREATMENT °The following therapies are usually used to treat GAD:  °· Medication. Antidepressant medication usually is prescribed for long-term daily control. Antianxiety medicines may be added in severe cases, especially when panic attacks occur.   °· Talk therapy (psychotherapy). Certain   talk therapy can be helpful in treating GAD by providing support, education, and guidance. A form of talk therapy called cognitive behavioral therapy can teach you healthy ways to think about and react  to daily life events and activities.  Stress managementtechniques. These include yoga, meditation, and exercise and can be very helpful when they are practiced regularly. A mental health specialist can help determine which treatment is best for you. Some people see improvement with one therapy. However, other people require a combination of therapies. Document Released: 06/17/2012 Document Revised: 07/07/2013 Document Reviewed: 06/17/2012 [facility] Patient Information 2015 [place], [place]. This information is not intended to replace advice given to you by your health care provider. Make sure you discuss any questions you have with your health care provider.

## 2013-10-28 ENCOUNTER — Encounter: Payer: Self-pay | Admitting: Women's Health

## 2013-10-28 ENCOUNTER — Ambulatory Visit (INDEPENDENT_AMBULATORY_CARE_PROVIDER_SITE_OTHER): Payer: BC Managed Care – PPO | Admitting: Women's Health

## 2013-10-28 VITALS — BP 110/60 | Ht 62.5 in | Wt 110.0 lb

## 2013-10-28 DIAGNOSIS — N949 Unspecified condition associated with female genital organs and menstrual cycle: Secondary | ICD-10-CM

## 2013-10-28 DIAGNOSIS — N83209 Unspecified ovarian cyst, unspecified side: Secondary | ICD-10-CM

## 2013-10-28 DIAGNOSIS — R102 Pelvic and perineal pain unspecified side: Secondary | ICD-10-CM

## 2013-10-28 DIAGNOSIS — Z01419 Encounter for gynecological examination (general) (routine) without abnormal findings: Secondary | ICD-10-CM

## 2013-10-28 DIAGNOSIS — Z3041 Encounter for surveillance of contraceptive pills: Secondary | ICD-10-CM

## 2013-10-28 DIAGNOSIS — F411 Generalized anxiety disorder: Secondary | ICD-10-CM

## 2013-10-28 LAB — URINALYSIS W MICROSCOPIC + REFLEX CULTURE
Bilirubin Urine: NEGATIVE
CASTS: NONE SEEN
Crystals: NONE SEEN
Glucose, UA: NEGATIVE mg/dL
KETONES UR: NEGATIVE mg/dL
NITRITE: NEGATIVE
PH: 6 (ref 5.0–8.0)
PROTEIN: NEGATIVE mg/dL
Specific Gravity, Urine: <1.005 — ABNORMAL LOW (ref 1.005–1.030)
UROBILINOGEN UA: 0.2 mg/dL (ref 0.0–1.0)

## 2013-10-28 MED ORDER — NORETHIN-ETH ESTRADIOL-FE 0.8-25 MG-MCG PO CHEW: 1.0000 | CHEWABLE_TABLET | Freq: Every day | ORAL | Status: DC

## 2013-10-28 NOTE — Progress Notes (Signed)
Chloe Hunt [birth year] [id number]    History:    Presents for annual exam and establish care, recently moved here from [place]  Regular monthly cycle on generesse. Struggling with anxiety has seen a psychiatrist in the past week and has been placed on Xanax XL for panic. Had been on Lexapro in the past for anxiety and depression but stopped several years ago. Was seen at the emergency room for anxiety/ Normal Pap, negative GC/Chlamydia, normal comprehensive metabolic panel, normal TSH and CBC primary care. Did not receive Gardasil. 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 One partner 10 years. 8/15//2015 pelvic ultrasound left ovarian cyst 3.6 cm.  Past medical history, past surgical history, family history and social history were all reviewed and documented in the EPIC chart. School counselor for a Publishing copy. Mother depression and substance abuse, numerous family members with alcohol abuse and depression.  ROS:  A  12 point ROS was performed and pertinent positives and negatives are included.  Exam:  Filed Vitals:   10/28/13 1515  BP: 110/60    General appearance:  Normal Thyroid:  Symmetrical, normal in size, without palpable masses or nodularity. Respiratory  Auscultation:  Clear without wheezing or rhonchi Cardiovascular  Auscultation:  Regular rate, without rubs, murmurs or gallops  Edema/varicosities:  Not grossly evident Abdominal  Soft,nontender, without masses, guarding or rebound.  Liver/spleen:  No organomegaly noted  Hernia:  None appreciated  Skin  Inspection:  Grossly normal   Breasts: Examined lying and sitting.     Right: Without masses, retractions, discharge or axillary adenopathy.     Left: Without masses, retractions, discharge or axillary adenopathy. Gentitourinary   Inguinal/mons:  Normal without inguinal adenopathy  External genitalia:  Normal  BUS/Urethra/Skene's glands:  Normal  Vagina:  Normal  Cervix:  Normal  Uterus:    normal in size, shape and contour.  Midline and mobile  Adnexa/parametria:     Rt: Without masses or tenderness.   Lt: Without masses or tenderness.  Anus and perineum: Normal  Digital rectal exam: Normal sphincter tone without palpated masses or tenderness  Assessment/Plan:  28 y.o. SWF G0 for annual exam.   Contraception management Left ovarian cyst Anxiety/panic  Plan: Ultrasound in October after cycle, will schedule. Generesse prescription, proper use given and reviewed slight risk for blood clots and strokes. SBE's, importance of regular exercise, calcium rich diet, MVI daily encouraged. Continue care with counselor/psychiatrist. Urine culture pending.  Note: This dictation was prepared with Dragon/digital dictation.  Any transcriptional errors that result are unintentional. Harrington Challenger [facility], 4:03 PM 10/28/2013

## 2013-10-28 NOTE — Patient Instructions (Signed)
Generalized Anxiety Disorder Generalized anxiety disorder (GAD) is a mental disorder. It interferes with life functions, including relationships, work, and school. GAD is different from normal anxiety, which everyone experiences at some point in their lives in response to specific life events and activities. Normal anxiety actually helps us prepare for and get through these life events and activities. Normal anxiety goes away after the event or activity is over.  GAD causes anxiety that is not necessarily related to specific events or activities. It also causes excess anxiety in proportion to specific events or activities. The anxiety associated with GAD is also difficult to control. GAD can vary from mild to severe. People with severe GAD can have intense waves of anxiety with physical symptoms (panic attacks).  SYMPTOMS The anxiety and worry associated with GAD are difficult to control. This anxiety and worry are related to many life events and activities and also occur more days than not for 6 months or longer. People with GAD also have three or more of the following symptoms (one or more in children):  Restlessness.   Fatigue.  Difficulty concentrating.   Irritability.  Muscle tension.  Difficulty sleeping or unsatisfying sleep. DIAGNOSIS GAD is diagnosed through an assessment by your health care provider. Your health care provider will ask you questions aboutyour mood,physical symptoms, and events in your life. Your health care provider may ask you about your medical history and use of alcohol or drugs, including prescription medicines. Your health care provider may also do a physical exam and blood tests. Certain medical conditions and the use of certain substances can cause symptoms similar to those associated with GAD. Your health care provider may refer you to a mental health specialist for further evaluation. TREATMENT The following therapies are usually used to treat GAD:    Medication. Antidepressant medication usually is prescribed for long-term daily control. Antianxiety medicines may be added in severe cases, especially when panic attacks occur.   Talk therapy (psychotherapy). Certain types of talk therapy can be helpful in treating GAD by providing support, education, and guidance. A form of talk therapy called cognitive behavioral therapy can teach you healthy ways to think about and react to daily life events and activities.  Stress managementtechniques. These include yoga, meditation, and exercise and can be very helpful when they are practiced regularly. A mental health specialist can help determine which treatment is best for you. Some people see improvement with one therapy. However, other people require a combination of therapies. Document Released: 06/17/2012 Document Revised: 07/07/2013 Document Reviewed: 06/17/2012 ExitCare Patient Information 2015 [employer]. This information is not intended to replace advice given to you by your health care provider. Make sure you discuss any questions you have with your health care provider.  

## 2013-10-29 LAB — URINE CULTURE
COLONY COUNT: NO GROWTH
ORGANISM ID, BACTERIA: NO GROWTH

## 2013-12-24 ENCOUNTER — Encounter: Payer: Self-pay | Admitting: Women's Health

## 2013-12-24 ENCOUNTER — Other Ambulatory Visit: Payer: Self-pay | Admitting: Women's Health

## 2013-12-24 ENCOUNTER — Ambulatory Visit (INDEPENDENT_AMBULATORY_CARE_PROVIDER_SITE_OTHER): Payer: BC Managed Care – PPO

## 2013-12-24 ENCOUNTER — Ambulatory Visit (INDEPENDENT_AMBULATORY_CARE_PROVIDER_SITE_OTHER): Payer: BC Managed Care – PPO | Admitting: Women's Health

## 2013-12-24 VITALS — BP 115/70 | Ht 63.0 in | Wt 115.0 lb

## 2013-12-24 DIAGNOSIS — N83201 Unspecified ovarian cyst, right side: Secondary | ICD-10-CM

## 2013-12-24 DIAGNOSIS — R102 Pelvic and perineal pain: Secondary | ICD-10-CM

## 2013-12-24 DIAGNOSIS — N83209 Unspecified ovarian cyst, unspecified side: Secondary | ICD-10-CM

## 2013-12-24 DIAGNOSIS — N8320 Unspecified ovarian cysts: Secondary | ICD-10-CM

## 2013-12-24 DIAGNOSIS — N83202 Unspecified ovarian cyst, left side: Principal | ICD-10-CM

## 2013-12-24 NOTE — Progress Notes (Signed)
Presents for follow up of ovarian cyst diagnosed 10/15/13 by Melvenia NeedlesF[facility] Med/ultrasound at [facility]. At that time, she was having nausea and lower abdominal pain. Vaginal ultrasound was completed which showed "appearance of involuted cyst" on right ovary and two hypoechoic masses (2.6 X 2.6 X 2.7 cm and 3.6 X 2.8 X 1.8 cm) with appearance of hemorrhagic cyst vs endometrioma on left ovary.  Since that time, all sxs have resolved. Pt is worried that she hasn't menstruated since Oct 09, 2013. Currently taking Generesse since Oct 28, 2013. Reports no possibility of pregnancy.   US  T/V retroverted uterus homogeneous. Rt ovary thin walled echo free cyst 29x27x1917mm=22mm, left ovary with two cysts diffuse homogeneous internal low level echoes hypoechoic, avascular. 1) 23x23x18=5521mm, 2) 17x14x3518mm. No change echo from prior U/S exam, decreased in size.  Well appearing with some anxiety concerning cysts  Right Functional Cyst Left cysts: endometrioma vs hemorrhagic cyst X2 smaller in size and 10/15/2013  Plan: Continue on Generesse daily. Counseled on how contraceptive pills at work, condoms encouraged if sexually active. Repeat US 3 months. CA 125 marker. If sxs return or worsen, should call office.

## 2013-12-24 NOTE — Patient Instructions (Signed)
Ovarian Cyst An ovarian cyst is a fluid-filled sac that forms on an ovary. The ovaries are small organs that produce eggs in women. Various types of cysts can form on the ovaries. Most are not cancerous. Many do not cause problems, and they often go away on their own. Some may cause symptoms and require treatment. Common types of ovarian cysts include:  Functional cysts--These cysts may occur every month during the menstrual cycle. This is normal. The cysts usually go away with the next menstrual cycle if the woman does not get pregnant. Usually, there are no symptoms with a functional cyst.  Endometrioma cysts--These cysts form from the tissue that lines the uterus. They are also called "chocolate cysts" because they become filled with blood that turns brown. This type of cyst can cause pain in the lower abdomen during intercourse and with your menstrual period.  Cystadenoma cysts--This type develops from the cells on the outside of the ovary. These cysts can get very big and cause lower abdomen pain and pain with intercourse. This type of cyst can twist on itself, cut off its blood supply, and cause severe pain. It can also easily rupture and cause a lot of pain.  Dermoid cysts--This type of cyst is sometimes found in both ovaries. These cysts may contain different kinds of body tissue, such as skin, teeth, hair, or cartilage. They usually do not cause symptoms unless they get very big.  Theca lutein cysts--These cysts occur when too much of a certain hormone (human chorionic gonadotropin) is produced and overstimulates the ovaries to produce an egg. This is most common after procedures used to assist with the conception of a baby (in vitro fertilization). CAUSES   Fertility drugs can cause a condition in which multiple large cysts are formed on the ovaries. This is called ovarian hyperstimulation syndrome.  A condition called polycystic ovary syndrome can cause hormonal imbalances that can lead to  nonfunctional ovarian cysts. SIGNS AND SYMPTOMS  Many ovarian cysts do not cause symptoms. If symptoms are present, they may include:  Pelvic pain or pressure.  Pain in the lower abdomen.  Pain during sexual intercourse.  Increasing girth (swelling) of the abdomen.  Abnormal menstrual periods.  Increasing pain with menstrual periods.  Stopping having menstrual periods without being pregnant. DIAGNOSIS  These cysts are commonly found during a routine or annual pelvic exam. Tests may be ordered to find out more about the cyst. These tests may include:  Ultrasound.  X-ray of the pelvis.  CT scan.  MRI.  Blood tests. TREATMENT  Many ovarian cysts go away on their own without treatment. Your health care provider may want to check your cyst regularly for 2-3 months to see if it changes. For women in menopause, it is particularly important to monitor a cyst closely because of the higher rate of ovarian cancer in menopausal women. When treatment is needed, it may include any of the following:  A procedure to drain the cyst (aspiration). This may be done using a long needle and ultrasound. It can also be done through a laparoscopic procedure. This involves using a thin, lighted tube with a tiny camera on the end (laparoscope) inserted through a small incision.  Surgery to remove the whole cyst. This may be done using laparoscopic surgery or an open surgery involving a larger incision in the lower abdomen.  Hormone treatment or birth control pills. These methods are sometimes used to help dissolve a cyst. HOME CARE INSTRUCTIONS   Only take over-the-counter   or prescription medicines as directed by your health care provider.  Follow up with your health care provider as directed.  Get regular pelvic exams and Pap tests. SEEK MEDICAL CARE IF:   Your periods are late, irregular, or painful, or they stop.  Your pelvic pain or abdominal pain does not go away.  Your abdomen becomes  larger or swollen.  You have pressure on your bladder or trouble emptying your bladder completely.  You have pain during sexual intercourse.  You have feelings of fullness, pressure, or discomfort in your stomach.  You lose weight for no apparent reason.  You feel generally ill.  You become constipated.  You lose your appetite.  You develop acne.  You have an increase in body and facial hair.  You are gaining weight, without changing your exercise and eating habits.  You think you are pregnant. SEEK IMMEDIATE MEDICAL CARE IF:   You have increasing abdominal pain.  You feel sick to your stomach (nauseous), and you throw up (vomit).  You develop a fever that comes on suddenly.  You have abdominal pain during a bowel movement.  Your menstrual periods become heavier than usual. MAKE SURE YOU:  Understand these instructions.  Will watch your condition.  Will get help right away if you are not doing well or get worse. Document Released: 02/20/2005 Document Revised: 02/25/2013 Document Reviewed: 10/28/2012 ExitCare Patient Information 2015 [employer]. This information is not intended to replace advice given to you by your health care provider. Make sure you discuss any questions you have with your health care provider.  

## 2013-12-25 LAB — CA 125: CA 125: 10 U/mL (ref <35)

## 2014-05-07 ENCOUNTER — Ambulatory Visit: Payer: BLUE CROSS/BLUE SHIELD

## 2014-05-07 ENCOUNTER — Ambulatory Visit (INDEPENDENT_AMBULATORY_CARE_PROVIDER_SITE_OTHER): Payer: BLUE CROSS/BLUE SHIELD

## 2014-05-07 ENCOUNTER — Ambulatory Visit (INDEPENDENT_AMBULATORY_CARE_PROVIDER_SITE_OTHER): Payer: BLUE CROSS/BLUE SHIELD | Admitting: Women's Health

## 2014-05-07 ENCOUNTER — Encounter: Payer: Self-pay | Admitting: Women's Health

## 2014-05-07 VITALS — BP 110/80

## 2014-05-07 DIAGNOSIS — N8320 Unspecified ovarian cysts: Secondary | ICD-10-CM | POA: Diagnosis not present

## 2014-05-07 DIAGNOSIS — Z3041 Encounter for surveillance of contraceptive pills: Secondary | ICD-10-CM | POA: Diagnosis not present

## 2014-05-07 MED ORDER — LEVONORGESTREL-ETHINYL ESTRAD 0.1-20 MG-MCG PO TABS: 1.0000 | ORAL_TABLET | Freq: Every day | ORAL | Status: DC

## 2014-05-07 NOTE — Progress Notes (Addendum)
Patient ID: Chloe ShockAmber E Hunt, female   DOB: 07-04-1985, 29 y.o.   MRN: [id number] Presents for follow-up of history of bilateral ovarian cysts 12/2013. Right ovarian cyst 29 x 29 x 17 mm, left ovarian cyst 23 x 23 x 18, 17 x 14 x 18 mm no change. CA 125  10. Started on generiss August 2015 light cycles after October cycle only spotting and no bleeding since. Increased frontal lobe-type headaches since starting OCs. No visual changes with headaches. History of anxiety, on Xanax slow release per psychiatrist.  Exam: Appears well. Ultrasound: Transvaginal and transabdominal retroverted uterus . Homogeneous. Right ovary normal previous cyst not seen. Left ovary with 2 thin-walled cyst 16 x 16 x 14 mm,  24 x 17 x 10 mm diffuse homogeneous low level echoes, avascular. Negative cul-de-sac.  Persistent left ovarian cyst questionable endometriomas Anxiety-psychiatrist manages  Plan: Contraception options reviewed will try Alesse, prescription, proper use given and reviewed slight risk for blood clots and strokes, if headaches persist follow-up with primary care. Reviewed cysts are slightly smaller, will watch at this time. Right cyst resolved.

## 2014-05-07 NOTE — Patient Instructions (Signed)
Ovarian Cyst An ovarian cyst is a fluid-filled sac that forms on an ovary. The ovaries are small organs that produce eggs in women. Various types of cysts can form on the ovaries. Most are not cancerous. Many do not cause problems, and they often go away on their own. Some may cause symptoms and require treatment. Common types of ovarian cysts include:  Functional cysts--These cysts may occur every month during the menstrual cycle. This is normal. The cysts usually go away with the next menstrual cycle if the woman does not get pregnant. Usually, there are no symptoms with a functional cyst.  Endometrioma cysts--These cysts form from the tissue that lines the uterus. They are also called "chocolate cysts" because they become filled with blood that turns brown. This type of cyst can cause pain in the lower abdomen during intercourse and with your menstrual period.  Cystadenoma cysts--This type develops from the cells on the outside of the ovary. These cysts can get very big and cause lower abdomen pain and pain with intercourse. This type of cyst can twist on itself, cut off its blood supply, and cause severe pain. It can also easily rupture and cause a lot of pain.  Dermoid cysts--This type of cyst is sometimes found in both ovaries. These cysts may contain different kinds of body tissue, such as skin, teeth, hair, or cartilage. They usually do not cause symptoms unless they get very big.  Theca lutein cysts--These cysts occur when too much of a certain hormone (human chorionic gonadotropin) is produced and overstimulates the ovaries to produce an egg. This is most common after procedures used to assist with the conception of a baby (in vitro fertilization). CAUSES   Fertility drugs can cause a condition in which multiple large cysts are formed on the ovaries. This is called ovarian hyperstimulation syndrome.  A condition called polycystic ovary syndrome can cause hormonal imbalances that can lead to  nonfunctional ovarian cysts. SIGNS AND SYMPTOMS  Many ovarian cysts do not cause symptoms. If symptoms are present, they may include:  Pelvic pain or pressure.  Pain in the lower abdomen.  Pain during sexual intercourse.  Increasing girth (swelling) of the abdomen.  Abnormal menstrual periods.  Increasing pain with menstrual periods.  Stopping having menstrual periods without being pregnant. DIAGNOSIS  These cysts are commonly found during a routine or annual pelvic exam. Tests may be ordered to find out more about the cyst. These tests may include:  Ultrasound.  X-ray of the pelvis.  CT scan.  MRI.  Blood tests. TREATMENT  Many ovarian cysts go away on their own without treatment. Your health care provider may want to check your cyst regularly for 2-3 months to see if it changes. For women in menopause, it is particularly important to monitor a cyst closely because of the higher rate of ovarian cancer in menopausal women. When treatment is needed, it may include any of the following:  A procedure to drain the cyst (aspiration). This may be done using a long needle and ultrasound. It can also be done through a laparoscopic procedure. This involves using a thin, lighted tube with a tiny camera on the end (laparoscope) inserted through a small incision.  Surgery to remove the whole cyst. This may be done using laparoscopic surgery or an open surgery involving a larger incision in the lower abdomen.  Hormone treatment or birth control pills. These methods are sometimes used to help dissolve a cyst. HOME CARE INSTRUCTIONS   Only take over-the-counter   or prescription medicines as directed by your health care provider.  Follow up with your health care provider as directed.  Get regular pelvic exams and Pap tests. SEEK MEDICAL CARE IF:   Your periods are late, irregular, or painful, or they stop.  Your pelvic pain or abdominal pain does not go away.  Your abdomen becomes  larger or swollen.  You have pressure on your bladder or trouble emptying your bladder completely.  You have pain during sexual intercourse.  You have feelings of fullness, pressure, or discomfort in your stomach.  You lose weight for no apparent reason.  You feel generally ill.  You become constipated.  You lose your appetite.  You develop acne.  You have an increase in body and facial hair.  You are gaining weight, without changing your exercise and eating habits.  You think you are pregnant. SEEK IMMEDIATE MEDICAL CARE IF:   You have increasing abdominal pain.  You feel sick to your stomach (nauseous), and you throw up (vomit).  You develop a fever that comes on suddenly.  You have abdominal pain during a bowel movement.  Your menstrual periods become heavier than usual. MAKE SURE YOU:  Understand these instructions.  Will watch your condition.  Will get help right away if you are not doing well or get worse. Document Released: 02/20/2005 Document Revised: 02/25/2013 Document Reviewed: 10/28/2012 ExitCare Patient Information 2015 [employer]. This information is not intended to replace advice given to you by your health care provider. Make sure you discuss any questions you have with your health care provider.  

## 2014-05-08 NOTE — Patient Instructions (Signed)
Ultrasound

## 2014-05-08 NOTE — Progress Notes (Signed)
Patient ID: Chloe Hunt, female   DOB: Oct 28, 1985, 29 y.o.   MRN: [id number] Ultrasound

## 2014-12-08 IMAGING — US US TRANSVAGINAL NON-OB
1 series · 13 of 25 positions shown · non-contrast
Comparison: None

CLINICAL DATA: Nausea for 4 days during her menstrual cycle, with
heavier than normal bleeding.

EXAM:
TRANSABDOMINAL AND TRANSVAGINAL ULTRASOUND OF PELVIS
TECHNIQUE: Both transabdominal and transvaginal ultrasound examinations of the
pelvis were performed. Transabdominal technique was performed for
global imaging of the pelvis including uterus, ovaries, adnexal
regions, and pelvic cul-de-sac. It was necessary to proceed with
endovaginal exam following the transabdominal exam to visualize the
uterus, endometrium and ovaries to better advantage.

[Series 1: us transvaginal non-ob · 0.30mm/px · 13 of 61 slices shown]
[im 1/61]
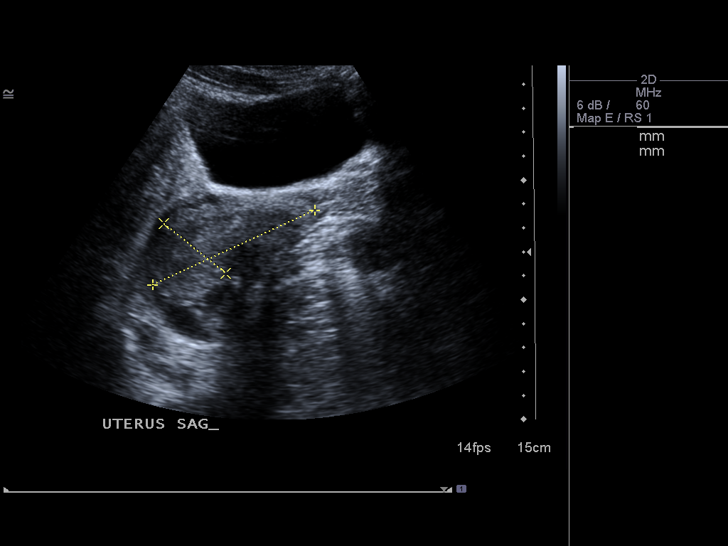
[im 6/61]
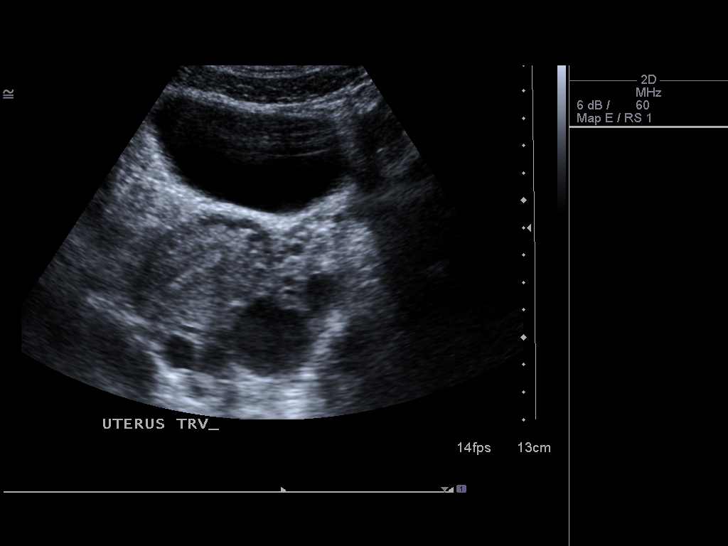
[im 11/61]
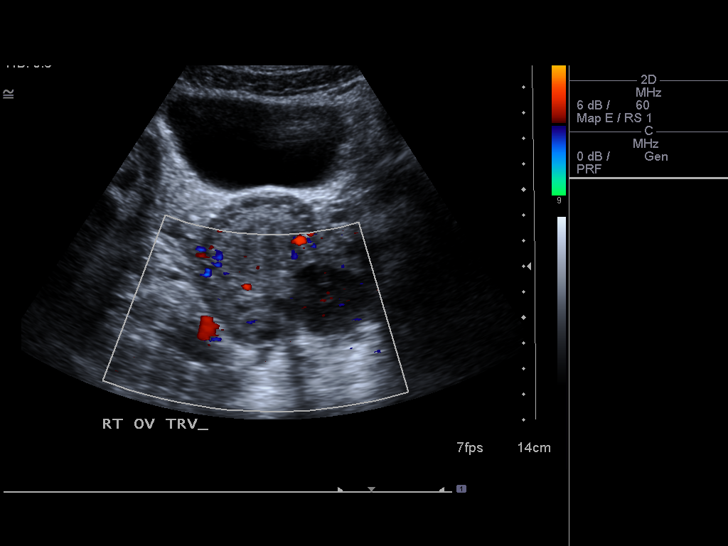
[im 16/61]
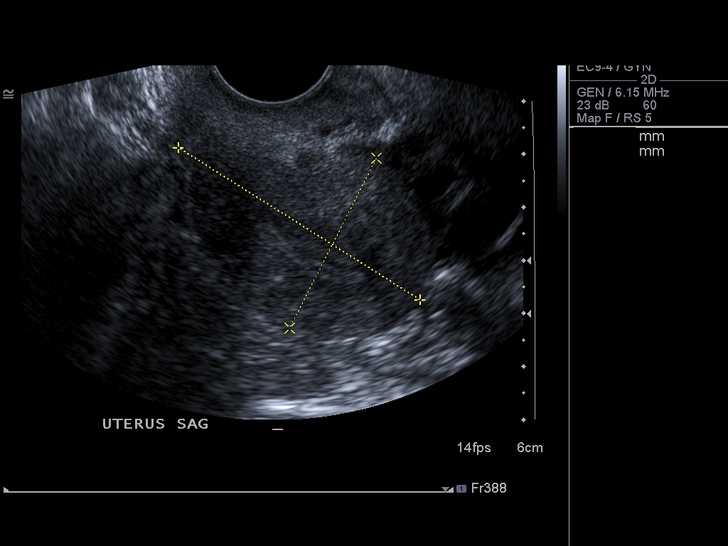
[im 21/61]
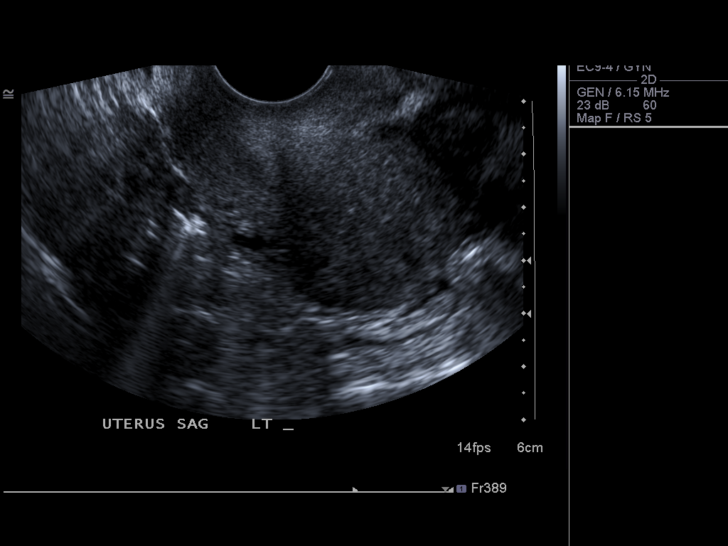
[im 26/61]
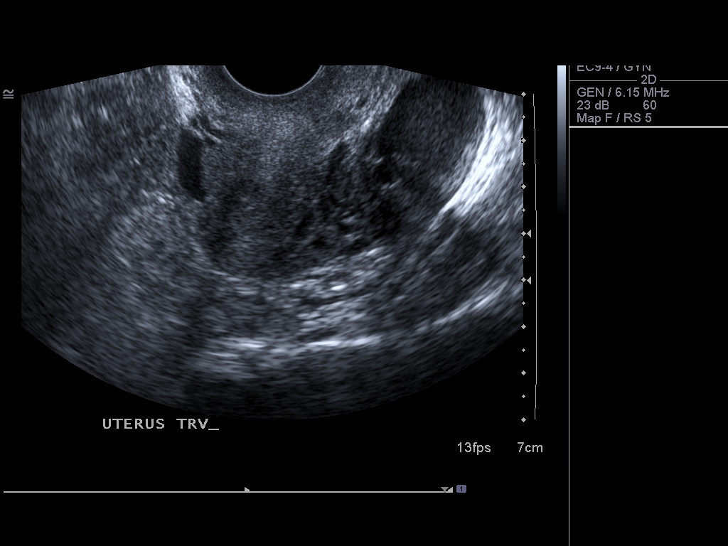
[im 31/61]
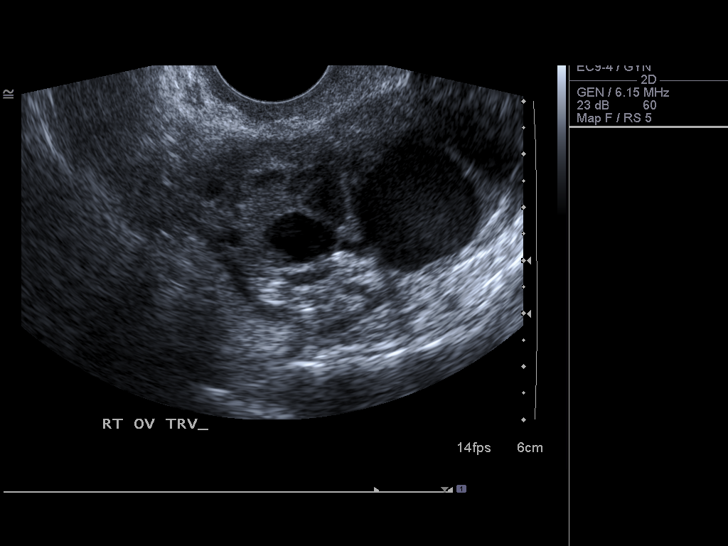
[im 36/61]
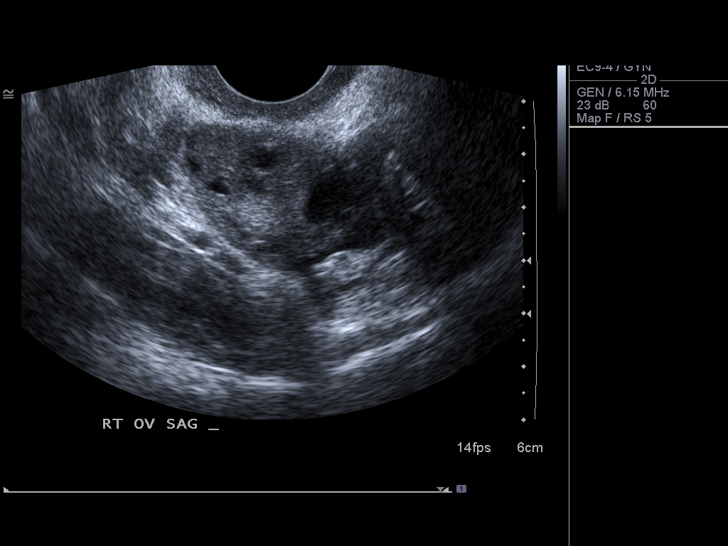
[im 41/61]
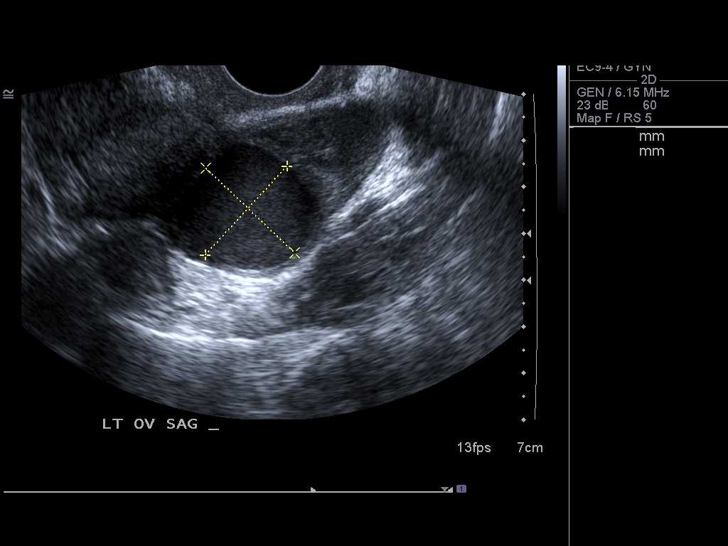
[im 46/61]
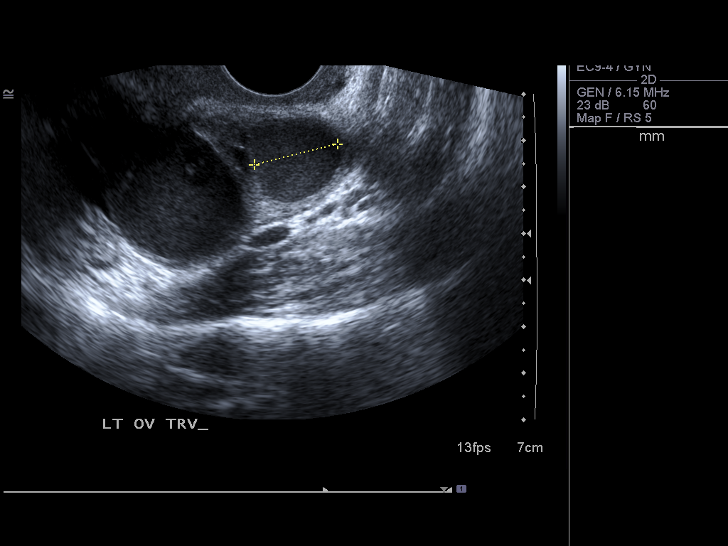
[im 51/61]
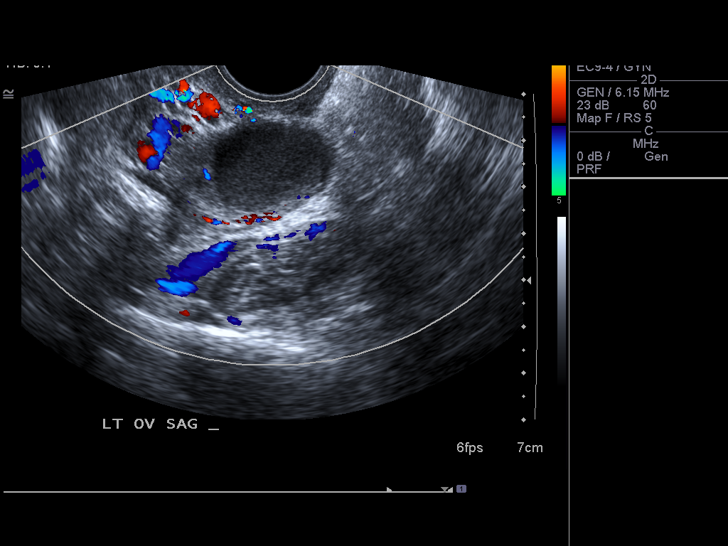
[im 56/61]
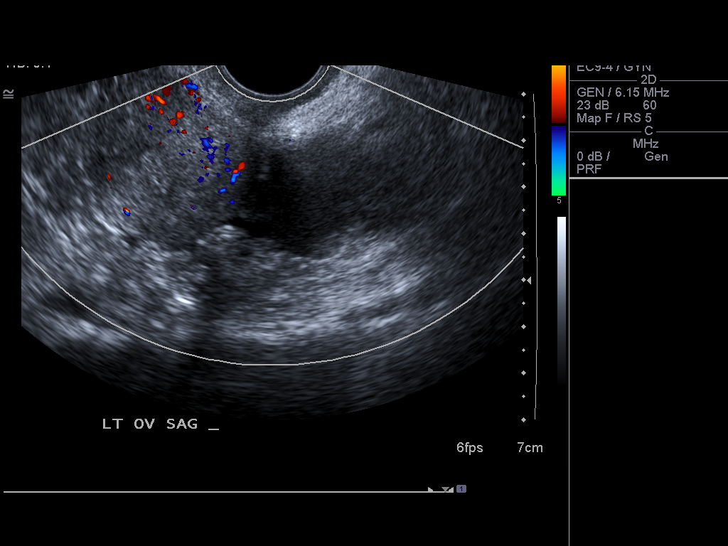
[im 61/61]
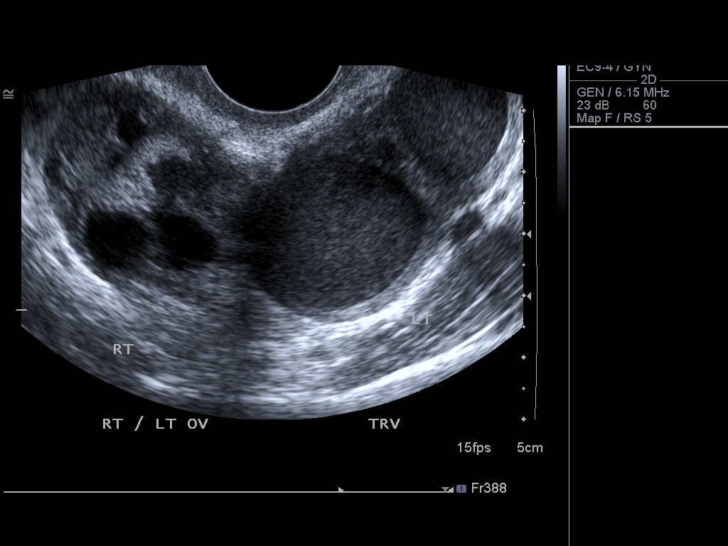

[13 of 25 positions shown; findings below may reference images not displayed]

FINDINGS: Uterus

Measurements: 5.4 cm x 3.6 cm by 4.4 cm. Uterus is retroverted. No
fibroids or other mass visualized.

Endometrium

Thickness: 9 mm.  No focal abnormality visualized.

Right ovary

Measurements: 4.3 cm x 2.7 cm x 3.3 cm. Normal appearance/no adnexal
mass.

Left ovary

Measurements: 5.8 cm x 2.8 cm x 3.6 cm. There are 2 dominant cysts
both containing internal echoes, largest measuring 3 point 6 cm x
2.8 cm the other measuring 2.5 cm in greatest dimension.

Other findings

Both ovaries show color Doppler blood flow. Ovaries are in close
proximity along the posterior aspect of the pelvis. No free fluid.
IMPRESSION: 1. Left ovarian cysts which contain internal echoes possibly from
previous hemorrhage. Largest cyst measures 3.6 cm.
2. Normal uterus and endometrium. No adnexal masses. No free fluid.

## 2014-12-08 IMAGING — CR DG ABDOMEN 2V
3 series · 3 of 3 positions shown · non-contrast
Comparison: None

CLINICAL DATA: Abdominal pain of unknown origin

EXAM:
ABDOMEN - 2 VIEW

[AP (1 of 3)]
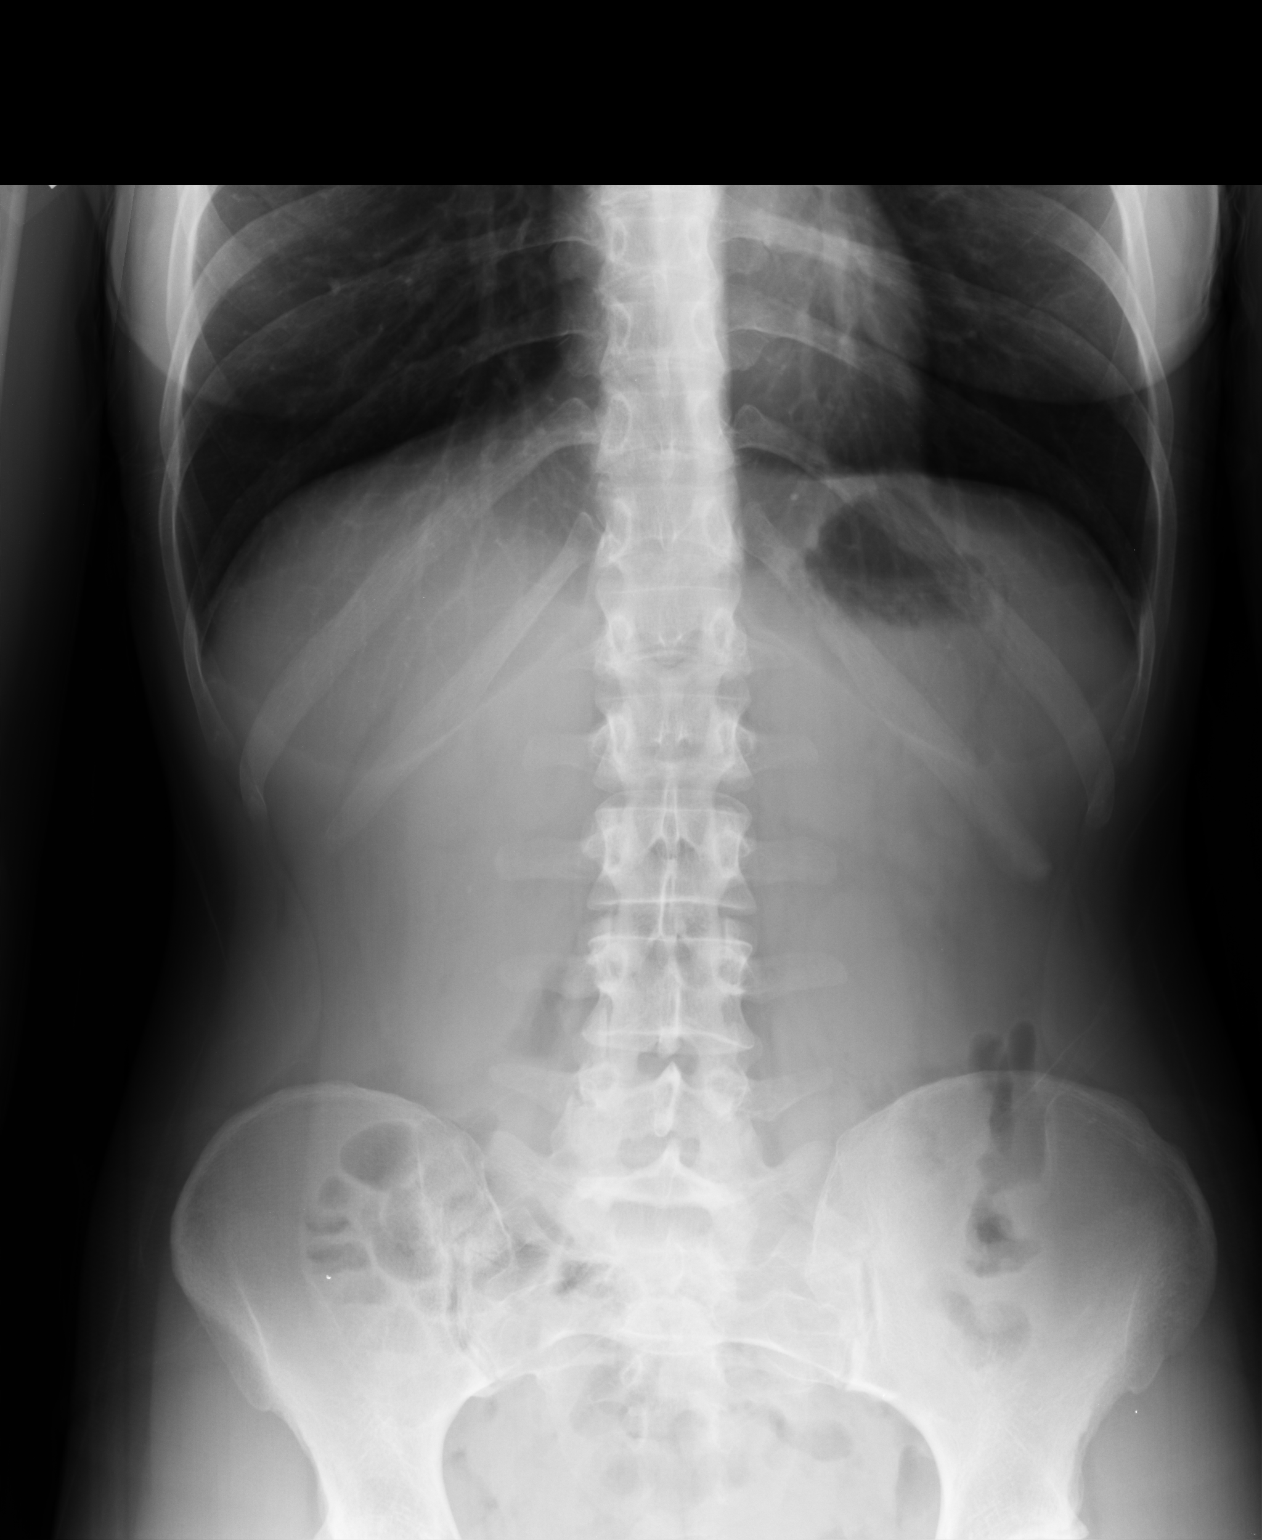

[AP (2 of 3)]
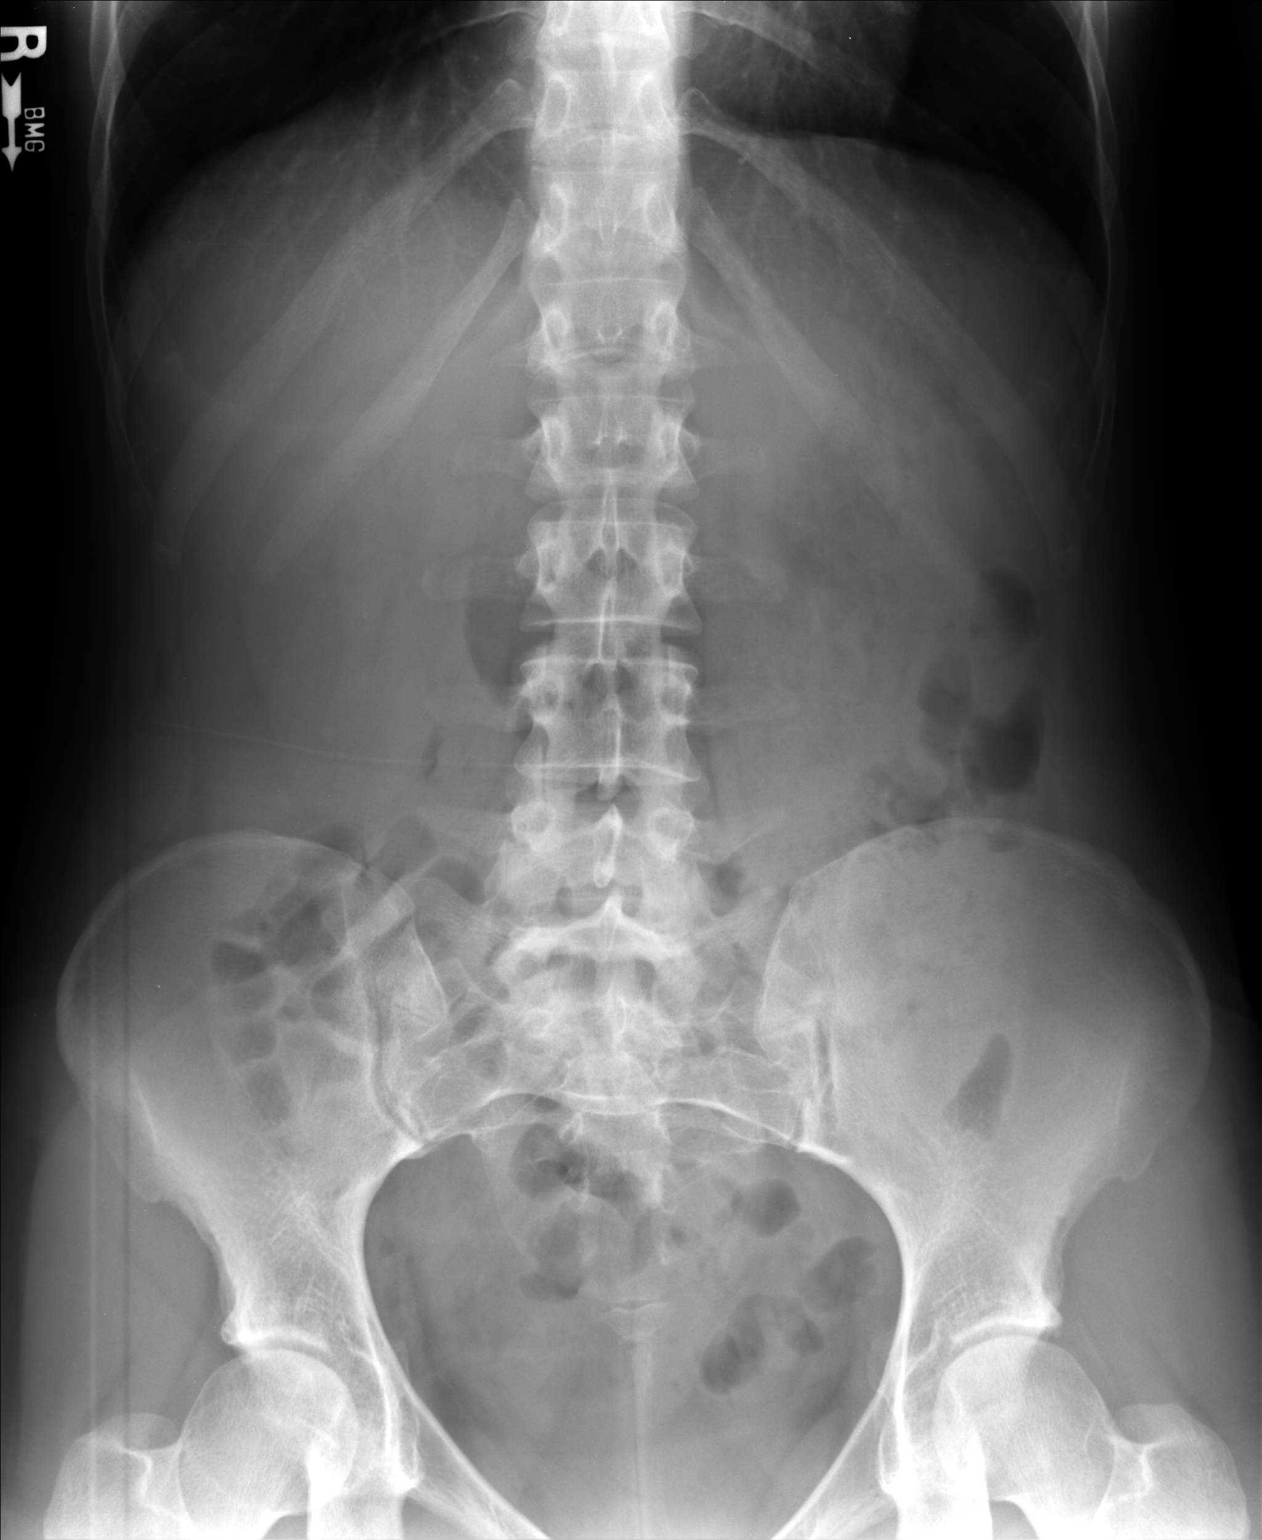

[AP (3 of 3)]
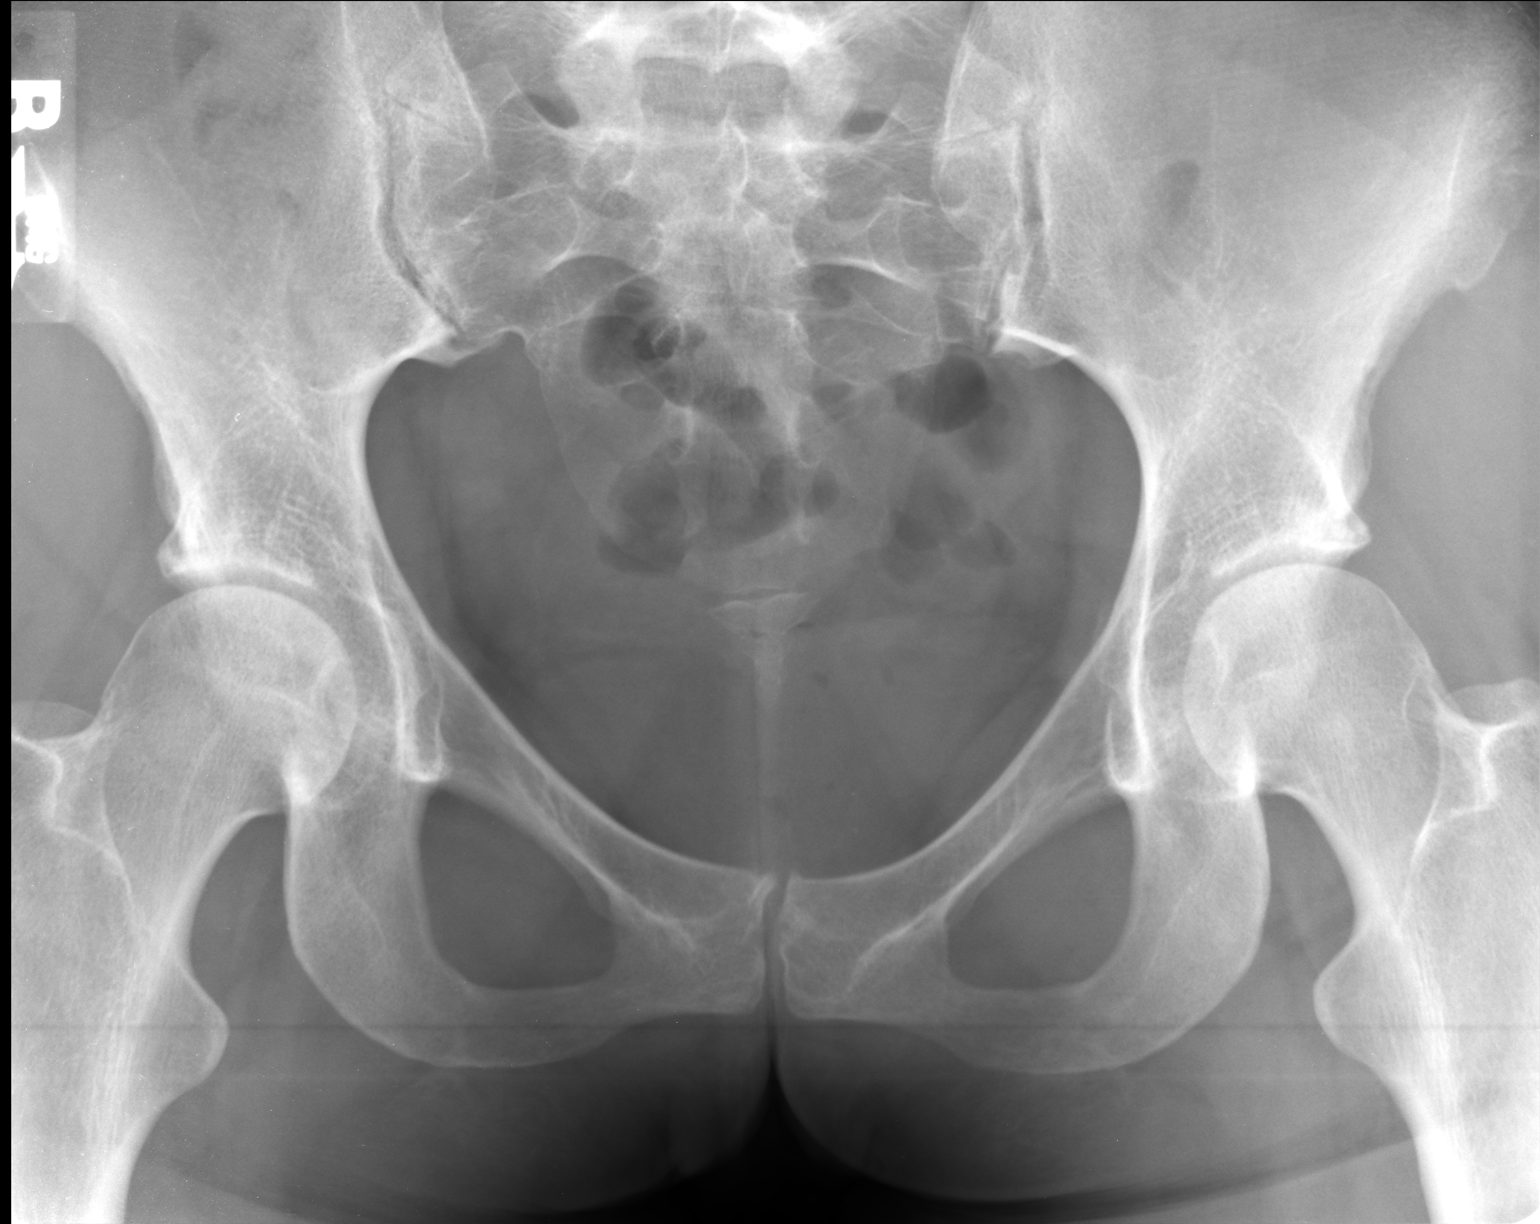

[3 of 3 positions shown; findings below may reference images not displayed]

FINDINGS: Lung bases clear.

Normal bowel gas pattern.

No bowel dilatation, bowel wall thickening or free intraperitoneal
air.

Bones unremarkable.

Probable tiny nonobstructing calculi and mid inferior pole of RIGHT
kidney.

No additional urinary tract calcifications.
IMPRESSION: Question tiny nonobstructing RIGHT renal calculi.

Otherwise negative exam.

## 2016-07-19 ENCOUNTER — Encounter: Payer: Self-pay | Admitting: Gynecology

## 2019-01-03 ENCOUNTER — Other Ambulatory Visit: Payer: Self-pay

## 2019-01-03 DIAGNOSIS — Z20822 Contact with and (suspected) exposure to covid-19: Secondary | ICD-10-CM

## 2019-01-06 LAB — NOVEL CORONAVIRUS, NAA: SARS-CoV-2, NAA: NOT DETECTED

## 2020-03-17 ENCOUNTER — Other Ambulatory Visit: Payer: Self-pay

## 2020-03-17 DIAGNOSIS — Z20822 Contact with and (suspected) exposure to covid-19: Secondary | ICD-10-CM

## 2020-03-19 LAB — NOVEL CORONAVIRUS, NAA: SARS-CoV-2, NAA: DETECTED — AB

## 2020-03-19 LAB — SARS-COV-2, NAA 2 DAY TAT

## 2021-02-08 ENCOUNTER — Emergency Department (HOSPITAL_BASED_OUTPATIENT_CLINIC_OR_DEPARTMENT_OTHER)
Admission: EM | Admit: 2021-02-08 | Discharge: 2021-02-08 | Disposition: A | Discharge status: Home / Self Care | Payer: BC Managed Care – PPO | Attending: Emergency Medicine | Admitting: Emergency Medicine

## 2021-02-08 ENCOUNTER — Encounter (HOSPITAL_BASED_OUTPATIENT_CLINIC_OR_DEPARTMENT_OTHER): Payer: Self-pay | Admitting: *Deleted

## 2021-02-08 ENCOUNTER — Other Ambulatory Visit: Payer: Self-pay

## 2021-02-08 DIAGNOSIS — Z87891 Personal history of nicotine dependence: Secondary | ICD-10-CM | POA: Insufficient documentation

## 2021-02-08 DIAGNOSIS — N3001 Acute cystitis with hematuria: Secondary | ICD-10-CM

## 2021-02-08 DIAGNOSIS — J45909 Unspecified asthma, uncomplicated: Secondary | ICD-10-CM | POA: Insufficient documentation

## 2021-02-08 DIAGNOSIS — R350 Frequency of micturition: Secondary | ICD-10-CM | POA: Diagnosis present

## 2021-02-08 LAB — URINALYSIS, ROUTINE W REFLEX MICROSCOPIC: Specific Gravity, Urine: <1.005 — ABNORMAL LOW (ref 1.005–1.030)

## 2021-02-08 LAB — PREGNANCY, URINE: Preg Test, Ur: NEGATIVE

## 2021-02-08 LAB — URINALYSIS, MICROSCOPIC (REFLEX)
Bacteria, UA: NONE SEEN
RBC / HPF: >50 RBC/hpf (ref 0–5)

## 2021-02-08 MED ORDER — SULFAMETHOXAZOLE-TRIMETHOPRIM 800-160 MG PO TABS: 1.0000 | ORAL_TABLET | Freq: Two times a day (BID) | ORAL | 0 refills | Status: AC

## 2021-02-08 MED ORDER — SULFAMETHOXAZOLE-TRIMETHOPRIM 800-160 MG PO TABS
1.0000 | ORAL_TABLET | Freq: Once | ORAL | Status: AC
  Administered 2021-02-08: 1 via ORAL

## 2021-02-08 MED ORDER — PHENAZOPYRIDINE HCL 200 MG PO TABS: 200.0000 mg | ORAL_TABLET | Freq: Three times a day (TID) | ORAL | 0 refills | Status: DC | PRN

## 2021-02-08 MED ORDER — PHENAZOPYRIDINE HCL 100 MG PO TABS
ORAL_TABLET | ORAL | Status: AC
  Filled 2021-02-08: qty 2

## 2021-02-08 MED ORDER — PHENAZOPYRIDINE HCL 100 MG PO TABS
200.0000 mg | ORAL_TABLET | Freq: Once | ORAL | Status: AC
  Administered 2021-02-08: 200 mg via ORAL

## 2021-02-08 MED ORDER — SULFAMETHOXAZOLE-TRIMETHOPRIM 800-160 MG PO TABS
ORAL_TABLET | ORAL | Status: AC
  Filled 2021-02-08: qty 1

## 2021-02-08 NOTE — ED Provider Notes (Signed)
[facility] Emergency Department Provider Note MRN:  [id number]  Arrival date & time: 02/08/21     Chief Complaint   Urinary Frequency   History of Present Illness   Chloe Hunt is a 35 y.o. year-old female with no pertinent past medical history presenting to the ED with chief complaint of urinary frequency.  Frequency, urgency, burning pain with urination for the past several hours.  Frequent trips to the bathroom to urinate.  Pain is improved when sitting on the toilet or bending forward standing up.  No flank pain, no fever, no other complaints.  Symptoms mild to moderate, constant.  Review of Systems  A complete 10 system review of systems was obtained and all systems are negative except as noted in the HPI and PMH.   Patient's Health History    Past Medical History:  Diagnosis Date   Anxiety    Asthma    Depression     Past Surgical History:  Procedure Laterality Date   TONSILLECTOMY      Family History  Problem Relation Age of Onset   Osteoporosis Mother    Anxiety disorder Mother    COPD Father    Hypertension Father     Social History   Socioeconomic History   Marital status: Single    Spouse name: Not on file   Number of children: Not on file   Years of education: Not on file   Highest education level: Not on file  Occupational History   Not on file  Tobacco Use   Smoking status: Former    Types: Cigarettes   Smokeless tobacco: Not on file  Substance and Sexual Activity   Alcohol use: Yes    Comment: socially   Drug use: Yes    Types: Marijuana   Sexual activity: Yes    Birth control/protection: Pill  Other Topics Concern   Not on file  Social History Narrative   Not on file   Social Determinants of Health   Financial Resource Strain: Not on file  Food Insecurity: Not on file  Transportation Needs: Not on file  Physical Activity: Not on file  Stress: Not on file  Social Connections: Not on file  Intimate  Partner Violence: Not on file     Physical Exam   Vitals:   02/08/21 0226  BP: 131/84  Pulse: 98  Resp: 16  Temp: 98 F (36.7 C)  SpO2: 100%    CONSTITUTIONAL: Well-appearing, NAD NEURO:  Alert and oriented x 3, no focal deficits EYES:  eyes equal and reactive ENT/NECK:  no LAD, no JVD CARDIO: Regular rate, well-perfused, normal S1 and S2 PULM:  CTAB no wheezing or rhonchi GI/GU:  normal bowel sounds, non-distended, non-tender MSK/SPINE:  No gross deformities, no edema SKIN:  no rash, atraumatic PSYCH:  Appropriate speech and behavior  *Additional and/or pertinent findings included in MDM below  Diagnostic and Interventional Summary    EKG Interpretation  Date/Time:    Ventricular Rate:    PR Interval:    QRS Duration:   QT Interval:    QTC Calculation:   R Axis:     Text Interpretation:         Labs Reviewed  URINALYSIS, ROUTINE W REFLEX MICROSCOPIC - Abnormal; Notable for the following components:      Result Value   Color, Urine RED (*)    APPearance CLOUDY (*)    Specific Gravity, Urine <1.005 (*)    Glucose, UA   (*)  Value: TEST NOT REPORTED DUE TO COLOR INTERFERENCE OF URINE PIGMENT   Hgb urine dipstick   (*)    Value: TEST NOT REPORTED DUE TO COLOR INTERFERENCE OF URINE PIGMENT   Bilirubin Urine   (*)    Value: TEST NOT REPORTED DUE TO COLOR INTERFERENCE OF URINE PIGMENT   Ketones, ur   (*)    Value: TEST NOT REPORTED DUE TO COLOR INTERFERENCE OF URINE PIGMENT   Protein, ur   (*)    Value: TEST NOT REPORTED DUE TO COLOR INTERFERENCE OF URINE PIGMENT   Nitrite   (*)    Value: TEST NOT REPORTED DUE TO COLOR INTERFERENCE OF URINE PIGMENT   Leukocytes,Ua   (*)    Value: TEST NOT REPORTED DUE TO COLOR INTERFERENCE OF URINE PIGMENT   All other components within normal limits  PREGNANCY, URINE  URINALYSIS, MICROSCOPIC (REFLEX)    No orders to display    Medications  sulfamethoxazole-trimethoprim (BACTRIM DS) 800-160 MG per tablet 1 tablet (1  tablet Oral Given 02/08/21 0328)  phenazopyridine (PYRIDIUM) tablet 200 mg (200 mg Oral Given 02/08/21 0328)     Procedures  /  Critical Care Procedures  ED Course and Medical Decision Making  I have reviewed the triage vital signs, the nursing notes, and pertinent available records from the EMR.  Listed above are laboratory and imaging tests that I personally ordered, reviewed, and interpreted and then considered in my medical decision making (see below for details).  History consistent with UTI.  Urinalysis with gross blood, will send for culture, appropriate for discharge on antibiotics.       Barth Kirks. [place]onia Small, [place] [email or web address]  Final Clinical Impressions(s) / ED Diagnoses     ICD-10-CM   1. Acute cystitis with hematuria  N30.01       ED Discharge Orders          Ordered    sulfamethoxazole-trimethoprim (BACTRIM DS) 800-160 MG tablet  2 times daily        02/08/21 0337    phenazopyridine (PYRIDIUM) 200 MG tablet  3 times daily PRN        02/08/21 [id number]             Discharge Instructions Discussed with and Provided to Patient:    Discharge Instructions      You were evaluated in the Emergency Department and after careful evaluation, we did not find any emergent condition requiring admission or further testing in the hospital.  Your exam/testing today was overall reassuring.  Symptoms seem to be due to a UTI.  Use the Bactrim antibiotic as directed.  Can use the Pyridium medication as needed for discomfort.  Please return to the Emergency Department if you experience any worsening of your condition.  Thank you for allowing [place] to be a part of your care.        Maudie Flakes, MD 02/08/21 [phone]

## 2021-02-08 NOTE — ED Triage Notes (Signed)
Pt states she thinks she has a UTI and having pain with urination, frequency and blood in urine.

## 2021-02-08 NOTE — Discharge Instructions (Signed)
You were evaluated in the Emergency Department and after careful evaluation, we did not find any emergent condition requiring admission or further testing in the hospital.  Your exam/testing today was overall reassuring.  Symptoms seem to be due to a UTI.  Use the Bactrim antibiotic as directed.  Can use the Pyridium medication as needed for discomfort.  Please return to the Emergency Department if you experience any worsening of your condition.  Thank you for allowing [place] to be a part of your care.

## 2021-02-10 LAB — URINE CULTURE: Culture: >=100000 — AB

## 2021-02-11 ENCOUNTER — Telehealth: Payer: Self-pay | Admitting: *Deleted

## 2021-02-11 NOTE — Telephone Encounter (Signed)
Post ED Visit - Positive Culture Follow-up  Culture report reviewed by antimicrobial stewardship pharmacist: [facility] []  , Pharm.D. []  Enzo Bi, Pharm.D., BCPS AQ-ID []  , Pharm.D., BCPS []  Celedonio Miyamoto, Pharm.D., BCPS []  [place], Garvin Fila.D., BCPS, AAHIVP []  , Pharm.D., BCPS, AAHIVP []  Georgina Pillion, PharmD, BCPS []  , PharmD, BCPS []  [place], PharmD, BCPS []  [place], PharmD []  , PharmD, BCPS []  Estella Husk, PharmD  Pharmacy Team []  Lysle Pearl, PharmD []  , PharmD []  Phillips Climes, PharmD []  , Rph []  Agapito Games) , PharmD []  Verlan Friends, PharmD []  , PharmD []  Mervyn Gay, PharmD []  , PharmD []  Vinnie Level, PharmD []  Wonda [facility], PharmD []  , PharmD []  Len Childs, PharmD   Positive urine culture Treated with Sulfamethoxazole-Trimethoprim, organism sensitive to the same and no further patient follow-up is required at this time.  , PharmD  Greer Pickerel Talley 02/11/2021, 8:20 AM

## 2021-12-08 ENCOUNTER — Other Ambulatory Visit: Payer: Self-pay

## 2021-12-08 ENCOUNTER — Emergency Department (HOSPITAL_COMMUNITY): Payer: BC Managed Care – PPO

## 2021-12-08 ENCOUNTER — Observation Stay (HOSPITAL_BASED_OUTPATIENT_CLINIC_OR_DEPARTMENT_OTHER): Payer: BC Managed Care – PPO

## 2021-12-08 ENCOUNTER — Encounter (HOSPITAL_COMMUNITY): Payer: Self-pay | Admitting: Obstetrics and Gynecology

## 2021-12-08 ENCOUNTER — Observation Stay (HOSPITAL_COMMUNITY)
Admission: EM | Admit: 2021-12-08 | Discharge: 2021-12-09 | Disposition: A | Discharge status: Home / Self Care | Payer: BC Managed Care – PPO | Attending: Obstetrics and Gynecology | Admitting: Obstetrics and Gynecology

## 2021-12-08 DIAGNOSIS — Z87891 Personal history of nicotine dependence: Secondary | ICD-10-CM | POA: Insufficient documentation

## 2021-12-08 DIAGNOSIS — S301XXA Contusion of abdominal wall, initial encounter: Secondary | ICD-10-CM | POA: Insufficient documentation

## 2021-12-08 DIAGNOSIS — Y92410 Unspecified street and highway as the place of occurrence of the external cause: Secondary | ICD-10-CM | POA: Diagnosis not present

## 2021-12-08 DIAGNOSIS — S8012XA Contusion of left lower leg, initial encounter: Secondary | ICD-10-CM | POA: Diagnosis not present

## 2021-12-08 DIAGNOSIS — O09513 Supervision of elderly primigravida, third trimester: Secondary | ICD-10-CM | POA: Insufficient documentation

## 2021-12-08 DIAGNOSIS — J45909 Unspecified asthma, uncomplicated: Secondary | ICD-10-CM | POA: Insufficient documentation

## 2021-12-08 DIAGNOSIS — Z3A34 34 weeks gestation of pregnancy: Secondary | ICD-10-CM

## 2021-12-08 DIAGNOSIS — O99513 Diseases of the respiratory system complicating pregnancy, third trimester: Secondary | ICD-10-CM | POA: Diagnosis not present

## 2021-12-08 DIAGNOSIS — O9A213 Injury, poisoning and certain other consequences of external causes complicating pregnancy, third trimester: Principal | ICD-10-CM | POA: Insufficient documentation

## 2021-12-08 DIAGNOSIS — O0993 Supervision of high risk pregnancy, unspecified, third trimester: Secondary | ICD-10-CM | POA: Insufficient documentation

## 2021-12-08 DIAGNOSIS — O403XX0 Polyhydramnios, third trimester, not applicable or unspecified: Secondary | ICD-10-CM

## 2021-12-08 DIAGNOSIS — O0990 Supervision of high risk pregnancy, unspecified, unspecified trimester: Secondary | ICD-10-CM

## 2021-12-08 DIAGNOSIS — O4443 Low lying placenta NOS or without hemorrhage, third trimester: Secondary | ICD-10-CM | POA: Insufficient documentation

## 2021-12-08 DIAGNOSIS — S80812A Abrasion, left lower leg, initial encounter: Secondary | ICD-10-CM

## 2021-12-08 DIAGNOSIS — Z79899 Other long term (current) drug therapy: Secondary | ICD-10-CM | POA: Insufficient documentation

## 2021-12-08 DIAGNOSIS — Z3493 Encounter for supervision of normal pregnancy, unspecified, third trimester: Principal | ICD-10-CM

## 2021-12-08 DIAGNOSIS — R1084 Generalized abdominal pain: Secondary | ICD-10-CM | POA: Diagnosis not present

## 2021-12-08 LAB — COMPREHENSIVE METABOLIC PANEL
ALT: 18 U/L (ref 0–44)
AST: 24 U/L (ref 15–41)
Albumin: 2.9 g/dL — ABNORMAL LOW (ref 3.5–5.0)
Alkaline Phosphatase: 109 U/L (ref 38–126)
Anion gap: 11 (ref 5–15)
BUN: 6 mg/dL (ref 6–20)
CO2: 18 mmol/L — ABNORMAL LOW (ref 22–32)
Calcium: 8.7 mg/dL — ABNORMAL LOW (ref 8.9–10.3)
Chloride: 107 mmol/L (ref 98–111)
Creatinine, Ser: 0.52 mg/dL (ref 0.44–1.00)
GFR, Estimated: >60 mL/min (ref >60)
Glucose, Bld: 84 mg/dL (ref 70–99)
Potassium: 3.7 mmol/L (ref 3.5–5.1)
Sodium: 136 mmol/L (ref 135–145)
Total Bilirubin: 0.6 mg/dL (ref 0.3–1.2)
Total Protein: 5.9 g/dL — ABNORMAL LOW (ref 6.5–8.1)

## 2021-12-08 LAB — CBC WITH DIFFERENTIAL/PLATELET
Abs Immature Granulocytes: 0.36 10*3/uL — ABNORMAL HIGH (ref 0.00–0.07)
Basophils Absolute: 0.1 10*3/uL (ref 0.0–0.1)
Basophils Relative: 1 %
Eosinophils Absolute: 0.3 10*3/uL (ref 0.0–0.5)
Eosinophils Relative: 3 %
HCT: 33.4 % — ABNORMAL LOW (ref 36.0–46.0)
Hemoglobin: 10.9 g/dL — ABNORMAL LOW (ref 12.0–15.0)
Immature Granulocytes: 3 %
Lymphocytes Relative: 18 %
Lymphs Abs: 2.2 10*3/uL (ref 0.7–4.0)
MCH: 29.3 pg (ref 26.0–34.0)
MCHC: 32.6 g/dL (ref 30.0–36.0)
MCV: 89.8 fL (ref 80.0–100.0)
Monocytes Absolute: 1 10*3/uL (ref 0.1–1.0)
Monocytes Relative: 8 %
Neutro Abs: 8.3 10*3/uL — ABNORMAL HIGH (ref 1.7–7.7)
Neutrophils Relative %: 67 %
Platelets: 202 10*3/uL (ref 150–400)
RBC: 3.72 MIL/uL — ABNORMAL LOW (ref 3.87–5.11)
RDW: 12.9 % (ref 11.5–15.5)
WBC: 12.2 10*3/uL — ABNORMAL HIGH (ref 4.0–10.5)
nRBC: 0 % (ref 0.0–0.2)

## 2021-12-08 LAB — TYPE AND SCREEN
ABO/RH(D): A POS
Antibody Screen: NEGATIVE

## 2021-12-08 MED ORDER — ACETAMINOPHEN 325 MG PO TABS
650.0000 mg | ORAL_TABLET | ORAL | Status: DC | PRN
  Administered 2021-12-09: 650 mg via ORAL
  Filled 2021-12-08: qty 2

## 2021-12-08 MED ORDER — ZOLPIDEM TARTRATE 5 MG PO TABS
5.0000 mg | ORAL_TABLET | Freq: Every evening | ORAL | Status: DC | PRN
  Filled 2021-12-08: qty 1

## 2021-12-08 MED ORDER — DOCUSATE SODIUM 100 MG PO CAPS: 100.0000 mg | ORAL_CAPSULE | Freq: Two times a day (BID) | ORAL | Status: DC | PRN

## 2021-12-08 MED ORDER — LACTATED RINGERS IV BOLUS
1000.0000 mL | Freq: Once | INTRAVENOUS | Status: AC
  Administered 2021-12-08: 1000 mL via INTRAVENOUS

## 2021-12-08 MED ORDER — CALCIUM CARBONATE ANTACID 500 MG PO CHEW: 2.0000 | CHEWABLE_TABLET | ORAL | Status: DC | PRN

## 2021-12-08 MED ORDER — LACTATED RINGERS IV SOLN: INTRAVENOUS | Status: DC

## 2021-12-08 MED ORDER — QUETIAPINE FUMARATE ER 50 MG PO TB24
150.0000 mg | ORAL_TABLET | Freq: Every day | ORAL | Status: DC
  Administered 2021-12-08: 150 mg via ORAL
  Filled 2021-12-08 (×2): qty 3

## 2021-12-08 MED ORDER — ACETAMINOPHEN 325 MG PO TABS
650.0000 mg | ORAL_TABLET | Freq: Once | ORAL | Status: AC
Start: 2021-12-08 — End: 2021-12-08
  Administered 2021-12-08: 650 mg via ORAL
  Filled 2021-12-08: qty 2

## 2021-12-08 NOTE — Progress Notes (Signed)
Orthopedic Tech Progress Note Patient Details:  Chloe Hunt April 28, 1985 [id number]  Level 2 trauma, ortho tech services not needed at this moment.  Patient ID: Chloe Hunt, female   DOB: [birth year], 36 y.o.   MRN: [id number]  Chloe Hunt 12/08/2021, 7:36 PM
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
## 2021-12-08 NOTE — Progress Notes (Signed)
FHR tracing still category I, UC noted. Patient endorses fetal movement and feeling lower uterine cramping. Report given to [facility] special care nurse and Transporting patient from ED to [facility]

## 2021-12-09 ENCOUNTER — Encounter (HOSPITAL_COMMUNITY): Payer: Self-pay | Admitting: Obstetrics and Gynecology

## 2021-12-09 DIAGNOSIS — Z3A35 35 weeks gestation of pregnancy: Secondary | ICD-10-CM

## 2021-12-09 DIAGNOSIS — R102 Pelvic and perineal pain: Secondary | ICD-10-CM | POA: Diagnosis not present

## 2021-12-09 LAB — KLEIHAUER-BETKE STAIN
Fetal Cells %: 0 %
Quantitation Fetal Hemoglobin: 0 mL

## 2021-12-09 MED ORDER — FAMOTIDINE 20 MG PO TABS
20.0000 mg | ORAL_TABLET | Freq: Two times a day (BID) | ORAL | Status: DC
  Administered 2021-12-09 (×2): 20 mg via ORAL
  Filled 2021-12-09 (×2): qty 1

## 2021-12-09 NOTE — Progress Notes (Signed)
OB Note Patient back from ultrasound and feeling some contractions. Baby still category I and having q1-60m UCs, no VB. Ultrasound negative with slight poly noted.  General: patient comfortable Sterile spec exam negative with cervix visually closed  Continue to follow closely

## 2021-12-09 NOTE — Plan of Care (Signed)
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
